# Patient Record
Sex: Female | Born: 1995
Health system: Southern US, Community
[De-identification: ages and names within clinical notes are randomized; demographics above are authoritative.]

## PROBLEM LIST (undated history)

## (undated) DIAGNOSIS — F329 Major depressive disorder, single episode, unspecified: Secondary | ICD-10-CM

## (undated) DIAGNOSIS — F32A Depression, unspecified: Secondary | ICD-10-CM

## (undated) HISTORY — PX: WISDOM TOOTH EXTRACTION: SHX21

---

## 2017-06-19 ENCOUNTER — Ambulatory Visit (HOSPITAL_COMMUNITY)
Admission: EM | Admit: 2017-06-19 | Discharge: 2017-06-19 | Disposition: A | Discharge status: Home / Self Care | Payer: Federal, State, Local not specified - PPO | Attending: Family Medicine | Admitting: Family Medicine

## 2017-06-19 ENCOUNTER — Encounter (HOSPITAL_COMMUNITY): Payer: Self-pay | Admitting: Emergency Medicine

## 2017-06-19 DIAGNOSIS — J00 Acute nasopharyngitis [common cold]: Secondary | ICD-10-CM | POA: Diagnosis not present

## 2017-06-19 MED ORDER — IPRATROPIUM BROMIDE 0.06 % NA SOLN: 2.0000 | Freq: Four times a day (QID) | NASAL | 0 refills | Status: DC

## 2017-06-19 MED ORDER — BENZONATATE 100 MG PO CAPS: 100.0000 mg | ORAL_CAPSULE | Freq: Three times a day (TID) | ORAL | 0 refills | Status: DC

## 2017-06-19 MED ORDER — FLUTICASONE PROPIONATE 50 MCG/ACT NA SUSP: 2.0000 | Freq: Every day | NASAL | 0 refills | Status: DC

## 2017-06-19 NOTE — Discharge Instructions (Signed)
Tessalon for cough. Start flonase, atrovent nasal spray for nasal congestion/drainage. You can use over the counter nasal saline rinse such as neti pot for nasal congestion. Keep hydrated, your urine should be clear to pale yellow in color. Tylenol/motrin for fever and pain. Monitor for any worsening of symptoms, chest pain, shortness of breath, wheezing, swelling of the throat, follow up for reevaluation.  ° °For sore throat try using a honey-based tea. Use 3 teaspoons of honey with juice squeezed from half lemon. Place shaved pieces of ginger into 1/2-1 cup of water and warm over stove top. Then mix the ingredients and repeat every 4 hours as needed. ° °

## 2017-06-19 NOTE — ED Triage Notes (Signed)
PT was sick last week and it resolved.   PT started with symptoms again this Monday.  PT reports headache, sinus pressure, cough, chills, bodyaches.

## 2017-06-19 NOTE — ED Provider Notes (Signed)
[facility]    CSN: [id number] Arrival date & time: 06/19/17  1254     History   Chief Complaint Chief Complaint  Patient presents with  . URI    HPI Madison Wood is a 22 y.o. female.   22 year old female comes in for 3 day history of URI symptoms. States she was sick recently, but symptoms resolved prior to current symptom onset. Post nasal drainage, rhinorrhea, nonproductive cough, sore throat, temporal headache, body aches. States symptoms usually during the night. Denies fever, but with some chills at night. States has had decreased fluid and food intake due to sore throat and decrease appetite. Drinking tea/gingerale for symptoms. Never smoker.       History reviewed. No pertinent past medical history.  There are no active problems to display for this patient.   Past Surgical History:  Procedure Laterality Date  . WISDOM TOOTH EXTRACTION      OB History    No data available       Home Medications    Prior to Admission medications   Medication Sig Start Date End Date Taking? Authorizing Provider  benzonatate (TESSALON) 100 MG capsule Take 1 capsule (100 mg total) by mouth every 8 (eight) hours. 06/19/17   Cathie HoopsYu, Amy V, PA-C  fluticasone (FLONASE) 50 MCG/ACT nasal spray Place 2 sprays into both nostrils daily. 06/19/17   Cathie HoopsYu, Amy V, PA-C  ipratropium (ATROVENT) 0.06 % nasal spray Place 2 sprays into both nostrils 4 (four) times daily. 06/19/17   Belinda FisherYu, Amy V, PA-C    Family History No family history on file.  Social History Social History   Tobacco Use  . Smoking status: Not on file  Substance Use Topics  . Alcohol use: Not on file  . Drug use: Not on file     Allergies   Patient has no known allergies.   Review of Systems Review of Systems  Reason unable to perform ROS: See HPI as above.     Physical Exam Triage Vital Signs ED Triage Vitals  Enc Vitals Group     BP 06/19/17 1319 108/68     Pulse Rate 06/19/17 1319 92     Resp  06/19/17 1319 16     Temp 06/19/17 1319 98.4 F (36.9 C)     Temp Source 06/19/17 1319 Oral     SpO2 06/19/17 1319 100 %     Weight 06/19/17 1317 140 lb (63.5 kg)     Height 06/19/17 1317 5\' 9"  (1.753 m)     Head Circumference --      Peak Flow --      Pain Score 06/19/17 1317 3     Pain Loc --      Pain Edu? --      Excl. in GC? --    No data found.  Updated Vital Signs BP 108/68   Pulse 92   Temp 98.4 F (36.9 C) (Oral)   Resp 16   Ht 5\' 9"  (1.753 m)   Wt 140 lb (63.5 kg)   LMP 05/29/2017   SpO2 100%   BMI 20.67 kg/m   Physical Exam  Constitutional: She is oriented to person, place, and time. She appears well-developed and well-nourished. No distress.  HENT:  Head: Normocephalic and atraumatic.  Right Ear: External ear and ear canal normal. Tympanic membrane is erythematous. Tympanic membrane is not bulging.  Left Ear: External ear and ear canal normal. Tympanic membrane is erythematous. Tympanic membrane is not  bulging.  Nose: Mucosal edema and rhinorrhea present. Right sinus exhibits no maxillary sinus tenderness and no frontal sinus tenderness. Left sinus exhibits no maxillary sinus tenderness and no frontal sinus tenderness.  Mouth/Throat: Uvula is midline and mucous membranes are normal. Posterior oropharyngeal erythema present. No tonsillar exudate.  Eyes: Conjunctivae are normal. Pupils are equal, round, and reactive to light.  Neck: Normal range of motion. Neck supple.  Cardiovascular: Normal rate, regular rhythm and normal heart sounds. Exam reveals no gallop and no friction rub.  No murmur heard. Pulmonary/Chest: Effort normal and breath sounds normal. She has no decreased breath sounds. She has no wheezes. She has no rhonchi. She has no rales.  Lymphadenopathy:    She has no cervical adenopathy.  Neurological: She is alert and oriented to person, place, and time.  Skin: Skin is warm and dry.  Psychiatric: She has a normal mood and affect. Her behavior is  normal. Judgment normal.    UC Treatments / Results  Labs (all labs ordered are listed, but only abnormal results are displayed) Labs Reviewed - No data to display  EKG  EKG Interpretation None       Radiology No results found.  Procedures Procedures (including critical care time)  Medications Ordered in UC Medications - No data to display   Initial Impression / Assessment and Plan / UC Course  I have reviewed the triage vital signs and the nursing notes.  Pertinent labs & imaging results that were available during my care of the patient were reviewed by me and considered in my medical decision making (see chart for details).    Discussed with patient history and exam most consistent with viral URI. Symptomatic treatment as needed. Push fluids. Return precautions given.    Final Clinical Impressions(s) / UC Diagnoses   Final diagnoses:  Acute nasopharyngitis    ED Discharge Orders        Ordered    fluticasone (FLONASE) 50 MCG/ACT nasal spray  Daily     06/19/17 1348    ipratropium (ATROVENT) 0.06 % nasal spray  4 times daily     06/19/17 1348    benzonatate (TESSALON) 100 MG capsule  Every 8 hours     06/19/17 1348        Belinda Fisher, PA-C 06/19/17 1351

## 2017-11-11 ENCOUNTER — Encounter (HOSPITAL_COMMUNITY): Payer: Self-pay | Admitting: Emergency Medicine

## 2017-11-11 ENCOUNTER — Ambulatory Visit (HOSPITAL_COMMUNITY)
Admission: EM | Admit: 2017-11-11 | Discharge: 2017-11-11 | Disposition: A | Discharge status: Home / Self Care | Payer: Federal, State, Local not specified - PPO | Attending: Family Medicine | Admitting: Family Medicine

## 2017-11-11 ENCOUNTER — Other Ambulatory Visit: Payer: Self-pay

## 2017-11-11 DIAGNOSIS — R1011 Right upper quadrant pain: Secondary | ICD-10-CM | POA: Diagnosis not present

## 2017-11-11 DIAGNOSIS — R110 Nausea: Secondary | ICD-10-CM

## 2017-11-11 MED ORDER — KETOROLAC TROMETHAMINE 60 MG/2ML IM SOLN
60.0000 mg | Freq: Once | INTRAMUSCULAR | Status: AC
  Administered 2017-11-11: 60 mg via INTRAMUSCULAR

## 2017-11-11 MED ORDER — ONDANSETRON 4 MG PO TBDP: 4.0000 mg | ORAL_TABLET | Freq: Three times a day (TID) | ORAL | 0 refills | Status: DC | PRN

## 2017-11-11 MED ORDER — KETOROLAC TROMETHAMINE 60 MG/2ML IM SOLN
INTRAMUSCULAR | Status: AC
  Filled 2017-11-11: qty 2

## 2017-11-11 NOTE — Discharge Instructions (Addendum)
You have been seen today for abdominal pain. Your evaluation was not suggestive of any emergent condition requiring medical intervention at this time. However, some abdominal problems make take more time to appear. Therefore, it is important for you to watch for any new symptoms or worsening of your current condition. Please proceed to the Emergency Department immediately should you feel worse in any way or have any of the following symptoms: increasing or different abdominal pain, persistent vomiting, fever, or shaking chills.  Return here or to the Emergency Department if your pain does not begin to improve within the next 12-24 hours.  You may take over the counter Tylenol as directed on the bottle.

## 2017-11-11 NOTE — ED Provider Notes (Signed)
[facility]   [id number] 11/11/17 Arrival Time: 0932  ASSESSMENT & PLAN:  1. RUQ abdominal pain   2. Nausea without vomiting    Possible cholelithiasis. Elects overnight observation.  Meds ordered this encounter  Medications  . ketorolac (TORADOL) injection 60 mg  . ondansetron (ZOFRAN-ODT) 4 MG disintegrating tablet    Sig: Take 1 tablet (4 mg total) by mouth every 8 (eight) hours as needed for nausea or vomiting.    Dispense:  15 tablet    Refill:  0   Follow-up Information    [facility].   Specialty:  Emergency Medicine Why:  If symptoms worsen. Contact information: [address] [id number] [place] [id number] [phone]           Discharge Instructions     You have been seen today for abdominal pain. Your evaluation was not suggestive of any emergent condition requiring medical intervention at this time. However, some abdominal problems make take more time to appear. Therefore, it is important for you to watch for any new symptoms or worsening of your current condition. Please proceed to the Emergency Department immediately should you feel worse in any way or have any of the following symptoms: increasing or different abdominal pain, persistent vomiting, fever, or shaking chills.  Return here or to the Emergency Department if your pain does not begin to improve within the next 12-24 hours.  You may take over the counter Tylenol as directed on the bottle.    Reviewed expectations re: course of current medical issues. Questions answered. Outlined signs and symptoms indicating need for more acute intervention. Patient verbalized understanding. After Visit Summary given.   SUBJECTIVE:  Madison Wood is a 22 y.o. female who presents with complaint of intermittent abdominal discomfort. Onset gradual, yesterday. Location: RUQ without radiation. Described as aching and dull. Symptoms are  stable since beginning. Aggravating factors: eating, including fluid intake. Alleviating factors: none. Associated symptoms: mild fatigue. She denies arthralgias, chills, fever and vomiting. Appetite: decreased. PO intake: decreased. Ambulatory without assistance. Urinary symptoms: none. Normal bowel movements. H/O constipation. OTC treatment: none.  Patient's last menstrual period was 11/10/2017 (exact date). Past Surgical History:  Procedure Laterality Date  . WISDOM TOOTH EXTRACTION      ROS: As per HPI.  OBJECTIVE:  Vitals:   11/11/17 0943  BP: 116/71  Pulse: 87  Resp: 16  Temp: 98.7 F (37.1 C)  TempSrc: Oral  SpO2: 100%    General appearance: alert; no distress Lungs: clear to auscultation bilaterally Heart: regular rate and rhythm Abdomen: soft; non-distended; mild tenderness over RUQ; bowel sounds present; no masses or organomegaly; no guarding or rebound tenderness Back: no CVA tenderness; FROM at hips Extremities: no edema; symmetrical with no gross deformities Skin: warm and dry Neurologic: normal gait Psychological: alert and cooperative; normal mood and affect  No Known Allergies                                             PMH: Constipation.  Social History   Socioeconomic History  . Marital status: Single    Spouse name: Not on file  . Number of children: Not on file  . Years of education: Not on file  . Highest education level: Not on file  Occupational History  . Not on file  Social Needs  . Physicist, medicalinancial resource  strain: Not on file  . Food insecurity:    Worry: Not on file    Inability: Not on file  . Transportation needs:    Medical: Not on file    Non-medical: Not on file  Tobacco Use  . Smoking status: Never Smoker  . Smokeless tobacco: Never Used  Substance and Sexual Activity  . Alcohol use: Yes  . Drug use: Never  . Sexual activity: Not on file  Lifestyle  . Physical activity:    Days per week: Not on file    Minutes per session:  Not on file  . Stress: Not on file  Relationships  . Social connections:    Talks on phone: Not on file    Gets together: Not on file    Attends religious service: Not on file    Active member of club or organization: Not on file    Attends meetings of clubs or organizations: Not on file    Relationship status: Not on file  . Intimate partner violence:    Fear of current or ex partner: Not on file    Emotionally abused: Not on file    Physically abused: Not on file    Forced sexual activity: Not on file  Other Topics Concern  . Not on file  Social History Narrative  . Not on file   Family History  Problem Relation Age of Onset  . Healthy Mother   . Diabetes Father   . Healthy Sister      Mardella Layman, MD 11/11/17 [phone]

## 2017-11-11 NOTE — ED Triage Notes (Signed)
Pt reports RUQ pain that started last night about 2000.  Pt had one episode of vomiting up water this morning.

## 2018-01-16 ENCOUNTER — Encounter (HOSPITAL_COMMUNITY): Payer: Self-pay | Admitting: Emergency Medicine

## 2018-01-16 ENCOUNTER — Other Ambulatory Visit: Payer: Self-pay

## 2018-01-16 ENCOUNTER — Emergency Department (HOSPITAL_COMMUNITY)
Admission: EM | Admit: 2018-01-16 | Discharge: 2018-01-17 | Discharge status: Against Medical Advice | Payer: Federal, State, Local not specified - PPO | Attending: Emergency Medicine | Admitting: Emergency Medicine

## 2018-01-16 DIAGNOSIS — R109 Unspecified abdominal pain: Secondary | ICD-10-CM | POA: Insufficient documentation

## 2018-01-16 DIAGNOSIS — Z5321 Procedure and treatment not carried out due to patient leaving prior to being seen by health care provider: Secondary | ICD-10-CM | POA: Insufficient documentation

## 2018-01-16 HISTORY — DX: Depression, unspecified: F32.A

## 2018-01-16 HISTORY — DX: Major depressive disorder, single episode, unspecified: F32.9

## 2018-01-16 LAB — COMPREHENSIVE METABOLIC PANEL
ALT: 10 U/L (ref 0–44)
AST: 16 U/L (ref 15–41)
Albumin: 3.8 g/dL (ref 3.5–5.0)
Alkaline Phosphatase: 63 U/L (ref 38–126)
Anion gap: 10 (ref 5–15)
BUN: <5 mg/dL — ABNORMAL LOW (ref 6–20)
CHLORIDE: 106 mmol/L (ref 98–111)
CO2: 20 mmol/L — ABNORMAL LOW (ref 22–32)
CREATININE: 0.73 mg/dL (ref 0.44–1.00)
Calcium: 8.6 mg/dL — ABNORMAL LOW (ref 8.9–10.3)
GFR calc non Af Amer: >60 mL/min (ref >60)
Glucose, Bld: 82 mg/dL (ref 70–99)
POTASSIUM: 3.6 mmol/L (ref 3.5–5.1)
SODIUM: 136 mmol/L (ref 135–145)
Total Bilirubin: 1.2 mg/dL (ref 0.3–1.2)
Total Protein: 6.6 g/dL (ref 6.5–8.1)

## 2018-01-16 LAB — URINALYSIS, ROUTINE W REFLEX MICROSCOPIC
Bilirubin Urine: NEGATIVE
GLUCOSE, UA: NEGATIVE mg/dL
Ketones, ur: 80 mg/dL — AB
Nitrite: POSITIVE — AB
PH: 5 (ref 5.0–8.0)
PROTEIN: 30 mg/dL — AB
Specific Gravity, Urine: 1.018 (ref 1.005–1.030)

## 2018-01-16 LAB — CBC
HEMATOCRIT: 38.5 % (ref 36.0–46.0)
Hemoglobin: 12.4 g/dL (ref 12.0–15.0)
MCH: 30.9 pg (ref 26.0–34.0)
MCHC: 32.2 g/dL (ref 30.0–36.0)
MCV: 96 fL (ref 78.0–100.0)
PLATELETS: 314 10*3/uL (ref 150–400)
RBC: 4.01 MIL/uL (ref 3.87–5.11)
RDW: 12.4 % (ref 11.5–15.5)
WBC: 7.7 10*3/uL (ref 4.0–10.5)

## 2018-01-16 LAB — LIPASE, BLOOD: LIPASE: 24 U/L (ref 11–51)

## 2018-01-16 LAB — I-STAT BETA HCG BLOOD, ED (MC, WL, AP ONLY): I-stat hCG, quantitative: <5 m[IU]/mL (ref <5)

## 2018-01-16 NOTE — ED Triage Notes (Addendum)
C/o sharp upper abd pain (right and left lateral), lower back pain, and nausea x 2 days.  States she had RUQ pain approx 1 month ago and was told she likely had gallstones but never had an US.  Pain isn't worse after eating.  Denies urinary complaints.

## 2018-07-23 DIAGNOSIS — Z113 Encounter for screening for infections with a predominantly sexual mode of transmission: Secondary | ICD-10-CM | POA: Diagnosis not present

## 2018-10-21 DIAGNOSIS — K080 Exfoliation of teeth due to systemic causes: Secondary | ICD-10-CM | POA: Diagnosis not present

## 2018-11-02 DIAGNOSIS — F411 Generalized anxiety disorder: Secondary | ICD-10-CM | POA: Diagnosis not present

## 2018-11-02 DIAGNOSIS — F4310 Post-traumatic stress disorder, unspecified: Secondary | ICD-10-CM | POA: Diagnosis not present

## 2018-11-02 DIAGNOSIS — Z62810 Personal history of physical and sexual abuse in childhood: Secondary | ICD-10-CM | POA: Insufficient documentation

## 2018-11-02 DIAGNOSIS — Z8659 Personal history of other mental and behavioral disorders: Secondary | ICD-10-CM | POA: Diagnosis not present

## 2018-11-04 DIAGNOSIS — E559 Vitamin D deficiency, unspecified: Secondary | ICD-10-CM | POA: Diagnosis not present

## 2018-11-04 DIAGNOSIS — Z113 Encounter for screening for infections with a predominantly sexual mode of transmission: Secondary | ICD-10-CM | POA: Diagnosis not present

## 2018-11-04 DIAGNOSIS — Z30013 Encounter for initial prescription of injectable contraceptive: Secondary | ICD-10-CM | POA: Diagnosis not present

## 2018-11-10 DIAGNOSIS — F411 Generalized anxiety disorder: Secondary | ICD-10-CM | POA: Diagnosis not present

## 2018-11-18 DIAGNOSIS — F411 Generalized anxiety disorder: Secondary | ICD-10-CM | POA: Diagnosis not present

## 2018-11-20 DIAGNOSIS — Z01411 Encounter for gynecological examination (general) (routine) with abnormal findings: Secondary | ICD-10-CM | POA: Diagnosis not present

## 2018-11-20 LAB — HM PAP SMEAR: HM Pap smear: NEGATIVE

## 2018-11-24 DIAGNOSIS — F411 Generalized anxiety disorder: Secondary | ICD-10-CM | POA: Diagnosis not present

## 2018-11-30 DIAGNOSIS — F4310 Post-traumatic stress disorder, unspecified: Secondary | ICD-10-CM | POA: Diagnosis not present

## 2018-11-30 DIAGNOSIS — F411 Generalized anxiety disorder: Secondary | ICD-10-CM | POA: Diagnosis not present

## 2018-12-17 DIAGNOSIS — F411 Generalized anxiety disorder: Secondary | ICD-10-CM | POA: Diagnosis not present

## 2019-01-11 DIAGNOSIS — F4310 Post-traumatic stress disorder, unspecified: Secondary | ICD-10-CM | POA: Diagnosis not present

## 2019-01-11 DIAGNOSIS — F411 Generalized anxiety disorder: Secondary | ICD-10-CM | POA: Diagnosis not present

## 2019-01-20 DIAGNOSIS — Z3042 Encounter for surveillance of injectable contraceptive: Secondary | ICD-10-CM | POA: Diagnosis not present

## 2019-03-05 DIAGNOSIS — F411 Generalized anxiety disorder: Secondary | ICD-10-CM | POA: Diagnosis not present

## 2019-03-08 DIAGNOSIS — F411 Generalized anxiety disorder: Secondary | ICD-10-CM | POA: Diagnosis not present

## 2019-04-07 DIAGNOSIS — Z113 Encounter for screening for infections with a predominantly sexual mode of transmission: Secondary | ICD-10-CM | POA: Diagnosis not present

## 2019-04-07 DIAGNOSIS — Z3042 Encounter for surveillance of injectable contraceptive: Secondary | ICD-10-CM | POA: Diagnosis not present

## 2019-04-09 DIAGNOSIS — A749 Chlamydial infection, unspecified: Secondary | ICD-10-CM | POA: Diagnosis not present

## 2019-05-03 DIAGNOSIS — K080 Exfoliation of teeth due to systemic causes: Secondary | ICD-10-CM | POA: Diagnosis not present

## 2019-05-03 DIAGNOSIS — A749 Chlamydial infection, unspecified: Secondary | ICD-10-CM | POA: Diagnosis not present

## 2019-06-23 DIAGNOSIS — Z3042 Encounter for surveillance of injectable contraceptive: Secondary | ICD-10-CM | POA: Diagnosis not present

## 2019-09-08 DIAGNOSIS — Z3042 Encounter for surveillance of injectable contraceptive: Secondary | ICD-10-CM | POA: Diagnosis not present

## 2019-09-08 DIAGNOSIS — Z1152 Encounter for screening for COVID-19: Secondary | ICD-10-CM | POA: Diagnosis not present

## 2019-09-08 DIAGNOSIS — Z113 Encounter for screening for infections with a predominantly sexual mode of transmission: Secondary | ICD-10-CM | POA: Diagnosis not present

## 2019-09-26 DIAGNOSIS — R109 Unspecified abdominal pain: Secondary | ICD-10-CM | POA: Diagnosis not present

## 2019-09-26 DIAGNOSIS — R1011 Right upper quadrant pain: Secondary | ICD-10-CM | POA: Diagnosis not present

## 2019-09-26 DIAGNOSIS — K829 Disease of gallbladder, unspecified: Secondary | ICD-10-CM | POA: Diagnosis not present

## 2019-09-26 DIAGNOSIS — K828 Other specified diseases of gallbladder: Secondary | ICD-10-CM | POA: Diagnosis not present

## 2019-09-26 DIAGNOSIS — N390 Urinary tract infection, site not specified: Secondary | ICD-10-CM | POA: Diagnosis not present

## 2019-09-26 DIAGNOSIS — Z87891 Personal history of nicotine dependence: Secondary | ICD-10-CM | POA: Diagnosis not present

## 2019-11-24 DIAGNOSIS — Z01419 Encounter for gynecological examination (general) (routine) without abnormal findings: Secondary | ICD-10-CM | POA: Diagnosis not present

## 2019-11-24 DIAGNOSIS — R5383 Other fatigue: Secondary | ICD-10-CM | POA: Diagnosis not present

## 2019-11-24 DIAGNOSIS — Z124 Encounter for screening for malignant neoplasm of cervix: Secondary | ICD-10-CM | POA: Diagnosis not present

## 2019-11-24 DIAGNOSIS — Z3042 Encounter for surveillance of injectable contraceptive: Secondary | ICD-10-CM | POA: Diagnosis not present

## 2019-11-24 DIAGNOSIS — Z113 Encounter for screening for infections with a predominantly sexual mode of transmission: Secondary | ICD-10-CM | POA: Diagnosis not present

## 2019-11-25 DIAGNOSIS — Z113 Encounter for screening for infections with a predominantly sexual mode of transmission: Secondary | ICD-10-CM | POA: Diagnosis not present

## 2019-11-25 DIAGNOSIS — Z124 Encounter for screening for malignant neoplasm of cervix: Secondary | ICD-10-CM | POA: Diagnosis not present

## 2019-12-14 DIAGNOSIS — F411 Generalized anxiety disorder: Secondary | ICD-10-CM | POA: Diagnosis not present

## 2020-02-10 ENCOUNTER — Encounter: Payer: Self-pay | Admitting: Family Medicine

## 2020-02-10 ENCOUNTER — Ambulatory Visit: Payer: Federal, State, Local not specified - PPO | Admitting: Family Medicine

## 2020-02-10 ENCOUNTER — Other Ambulatory Visit: Payer: Self-pay

## 2020-02-10 VITALS — BP 118/78 | Ht 69.0 in | Wt 147.6 lb

## 2020-02-10 DIAGNOSIS — L299 Pruritus, unspecified: Secondary | ICD-10-CM | POA: Diagnosis not present

## 2020-02-10 DIAGNOSIS — R109 Unspecified abdominal pain: Secondary | ICD-10-CM | POA: Insufficient documentation

## 2020-02-10 DIAGNOSIS — M25532 Pain in left wrist: Secondary | ICD-10-CM | POA: Insufficient documentation

## 2020-02-10 DIAGNOSIS — F32A Depression, unspecified: Secondary | ICD-10-CM | POA: Diagnosis not present

## 2020-02-10 LAB — CBC WITH DIFFERENTIAL/PLATELET
Immature Grans (Abs): 0 10*3/uL (ref 0.0–0.1)
MCV: 96 fL (ref 79–97)
Neutrophils Absolute: 2 10*3/uL (ref 1.4–7.0)
Platelets: 314 10*3/uL (ref 150–450)
WBC: 5.1 10*3/uL (ref 3.4–10.8)

## 2020-02-10 LAB — COMPREHENSIVE METABOLIC PANEL

## 2020-02-10 LAB — T4, FREE

## 2020-02-10 LAB — TSH

## 2020-02-10 NOTE — Patient Instructions (Signed)
Try taking over-the-counter Claritin or Allegra or Zyrtec or Xyzal for your itching and see if this helps.  Continue using a topical pain medicine for your left wrist and let me know if this is getting worse.  We will be in touch with your lab results.

## 2020-02-10 NOTE — Progress Notes (Signed)
New patient visit   Patient: Madison Wood   DOB: [birth year]   24 y.o. Female  MRN: [id number] Visit Date: 02/10/2020  Today's healthcare provider: Hetty Blend, NP-C   Chief Complaint  Patient presents with  . New Patient (Initial Visit)  . Depression   Subjective    Madison Wood is a 24 y.o. female who presents today as a new patient to establish care.  HPI  Depression Patient reports she has depression and sees a psychiatrist for it. She is currently taking Escitalopram 5 MG and Hydroxyzine 25 MG. States she is not good about taking this consistently. She sees a therapist occasionally.  No new concerns with this.   Reports intermittent RUQ pain. Had an episode in May and gallbladder disease was ruled out. That was her last episode of pain. No pain today.  Denies fever, chills, night sweats, unexplained weight loss, dizziness, chest pain, palpitations, shortness of breath, abdominal pain, N/V/D, urinary symptoms.   She sees OB/GYN.  Moved here from [place] in August.  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   Feels like her body is "on fire" at times for the past 2 months. States she feels itchy all over. Denies rash. No new medications. States she recently changes her soap and detergent to see if this helps. She has noted some improvement.   Left radial wrist and thumb pain which is improving. No known injury but she was lifting heavy boxes prior to the pain starting. No numbness, tingling or weakness. No other arthralgias.   On Depo-provera injections.     Past Medical History:  Diagnosis Date  . Depression    Past Surgical History:  Procedure Laterality Date  . WISDOM TOOTH EXTRACTION     Family Status  Relation Name Status  . Mother  Alive  . Father  Alive  . Sister  Alive   Family History  Problem Relation Age of Onset  . Healthy Mother   . Diabetes Father   . Hypertension Father   . Healthy Sister   .  Allergies Sister    Social History   Socioeconomic History  . Marital status: Single    Spouse name: Not on file  . Number of children: Not on file  . Years of education: Not on file  . Highest education level: Not on file  Occupational History  . Not on file  Tobacco Use  . Smoking status: Never Smoker  . Smokeless tobacco: Never Used  Vaping Use  . Vaping Use: Never used  Substance and Sexual Activity  . Alcohol use: Yes  . Drug use: Never  . Sexual activity: Not on file  Other Topics Concern  . Not on file  Social History Narrative  . Not on file   Social Determinants of Health   Financial Resource Strain:   . Difficulty of Paying Living Expenses: Not on file  Food Insecurity:   . Worried About Programme researcher, broadcasting/film/video in the Last Year: Not on file  . Ran Out of Food in the Last Year: Not on file  Transportation Needs:   . Lack of Transportation (Medical): Not on file  . Lack of Transportation (Non-Medical): Not on file  Physical Activity:   . Days of Exercise per Week: Not on file  . Minutes of Exercise per Session: Not on file  Stress:   . Feeling of Stress : Not on file  Social Connections:   .  Frequency of Communication with Friends and Family: Not on file  . Frequency of Social Gatherings with Friends and Family: Not on file  . Attends Religious Services: Not on file  . Active Member of Clubs or Organizations: Not on file  . Attends Banker Meetings: Not on file  . Marital Status: Not on file   Outpatient Medications Prior to Visit  Medication Sig  . escitalopram (LEXAPRO) 5 MG tablet Take 5 mg by mouth daily.  . hydrOXYzine (ATARAX/VISTARIL) 25 MG tablet Take 25 mg by mouth 3 (three) times daily.  . [DISCONTINUED] benzonatate (TESSALON) 100 MG capsule Take 1 capsule (100 mg total) by mouth every 8 (eight) hours. (Patient not taking: Reported on 02/10/2020)  . [DISCONTINUED] fluticasone (FLONASE) 50 MCG/ACT nasal spray Place 2 sprays into both  nostrils daily. (Patient not taking: Reported on 02/10/2020)  . [DISCONTINUED] ipratropium (ATROVENT) 0.06 % nasal spray Place 2 sprays into both nostrils 4 (four) times daily. (Patient not taking: Reported on 02/10/2020)  . [DISCONTINUED] ondansetron (ZOFRAN-ODT) 4 MG disintegrating tablet Take 1 tablet (4 mg total) by mouth every 8 (eight) hours as needed for nausea or vomiting. (Patient not taking: Reported on 02/10/2020)   No facility-administered medications prior to visit.   No Known Allergies   There is no immunization history on file for this patient.  Health Maintenance  Topic Date Due  . Hepatitis C Screening  Never done  . HIV Screening  Never done  . TETANUS/TDAP  Never done  . PAP-Cervical Cytology Screening  Never done  . PAP SMEAR-Modifier  Never done  . INFLUENZA VACCINE  Never done    Patient Care Team: Patient, No Pcp Per as PCP - General (General Practice)  Review of Systems Pertinent positives and negatives in the history of present illness.    Objective    BP 118/78 (BP Location: Right Arm, Patient Position: Sitting, Cuff Size: Large)   Ht 5\' 9"  (1.753 m)   Wt 147 lb 9.6 oz (67 kg)   BMI 21.80 kg/m  Physical Exam Constitutional:      Appearance: Normal appearance. She is normal weight.  Eyes:     Conjunctiva/sclera: Conjunctivae normal.  Cardiovascular:     Rate and Rhythm: Normal rate and regular rhythm.     Pulses: Normal pulses.  Pulmonary:     Effort: Pulmonary effort is normal.     Breath sounds: Normal breath sounds.  Musculoskeletal:        General: Normal range of motion.     Cervical back: Normal range of motion and neck supple.  Skin:    General: Skin is warm and dry.     Coloration: Skin is not jaundiced.     Findings: No rash.  Neurological:     General: No focal deficit present.     Mental Status: She is alert and oriented to person, place, and time.     Sensory: No sensory deficit.     Motor: No weakness.  Psychiatric:         Mood and Affect: Mood normal.        Behavior: Behavior normal.     Depression Screen No flowsheet data found. No results found for any visits on 02/10/20.  Assessment & Plan      Left wrist pain  Depression, unspecified depression type  Intermittent abdominal pain - Plan: CBC with Differential/Platelet, Comprehensive metabolic panel  Pruritus - Plan: CBC with Differential/Platelet, Comprehensive metabolic panel, TSH, T4, free  She is  a pleasant 24 year old female who is new and here to establish care.  She has several conditions to discuss but not are bothersome today.  Her left wrist exam is normal and pain almost back to baseline per patient. She will let me know if this is more bothersome.  Depression is managed by her psychiatrist. Recommend taking her medication consistently.  Reviewed abdominal US report and no sign of gallbladder disease. No pain since May. Follow up as needed for this.  Skin exam is normal. Recommend trying a non drowsy antihistamine  Follow up pending labs or as needed.     Hetty Blend, NP-C  [facility] [phone] (phone) [phone] (fax)  [facility]

## 2020-02-11 DIAGNOSIS — Z3042 Encounter for surveillance of injectable contraceptive: Secondary | ICD-10-CM | POA: Diagnosis not present

## 2020-02-11 DIAGNOSIS — E559 Vitamin D deficiency, unspecified: Secondary | ICD-10-CM | POA: Diagnosis not present

## 2020-02-11 LAB — COMPREHENSIVE METABOLIC PANEL
AST: 15 IU/L (ref 0–40)
Albumin/Globulin Ratio: 1.7 (ref 1.2–2.2)
Albumin: 4.7 g/dL (ref 3.9–5.0)
BUN/Creatinine Ratio: 11 (ref 9–23)
Bilirubin Total: 0.8 mg/dL (ref 0.0–1.2)
Calcium: 9.3 mg/dL (ref 8.7–10.2)
Chloride: 105 mmol/L (ref 96–106)
Creatinine, Ser: 0.94 mg/dL (ref 0.57–1.00)
GFR calc Af Amer: 99 mL/min/{1.73_m2} (ref >59)
GFR calc non Af Amer: 86 mL/min/{1.73_m2} (ref >59)
Globulin, Total: 2.8 g/dL (ref 1.5–4.5)
Glucose: 90 mg/dL (ref 65–99)
Sodium: 140 mmol/L (ref 134–144)

## 2020-02-11 LAB — CBC WITH DIFFERENTIAL/PLATELET
Basophils Absolute: 0 10*3/uL (ref 0.0–0.2)
Basos: 0 %
EOS (ABSOLUTE): 0.1 10*3/uL (ref 0.0–0.4)
Eos: 1 %
Hematocrit: 41 % (ref 34.0–46.6)
Hemoglobin: 13.8 g/dL (ref 11.1–15.9)
Immature Granulocytes: 0 %
Lymphocytes Absolute: 2.5 10*3/uL (ref 0.7–3.1)
Lymphs: 50 %
MCH: 32.2 pg (ref 26.6–33.0)
MCHC: 33.7 g/dL (ref 31.5–35.7)
Monocytes Absolute: 0.5 10*3/uL (ref 0.1–0.9)
Monocytes: 10 %
Neutrophils: 39 %
RBC: 4.29 x10E6/uL (ref 3.77–5.28)
RDW: 11.6 % — ABNORMAL LOW (ref 11.7–15.4)

## 2020-02-11 NOTE — Progress Notes (Signed)
Her labs are fine. Nothing to explain her itching or symptoms. Let's see how she does with taking an over the counter antihistamine like Claritin, Allegra, Zyrtec or Xyzal.

## 2020-02-14 ENCOUNTER — Telehealth: Payer: Self-pay | Admitting: Family Medicine

## 2020-02-14 NOTE — Telephone Encounter (Signed)
Pt was notified.  

## 2020-02-14 NOTE — Telephone Encounter (Signed)
Pt was seen last Thursday and had labs done she was wondering if her iron was checked because she thinks it may be low. If not she is wondering if she can come back in to have it checked.

## 2020-02-14 NOTE — Telephone Encounter (Signed)
There is no indication for me to order an iron level.  Her hemoglobin is in a good range and there is no sign of anemia.  I am not sure if insurance will cover this but I am willing to order it if she wants. She may have to pay out of pocket if not covered.

## 2020-03-02 DIAGNOSIS — F411 Generalized anxiety disorder: Secondary | ICD-10-CM | POA: Diagnosis not present

## 2020-05-10 DIAGNOSIS — Z1389 Encounter for screening for other disorder: Secondary | ICD-10-CM | POA: Diagnosis not present

## 2020-05-10 DIAGNOSIS — Z3042 Encounter for surveillance of injectable contraceptive: Secondary | ICD-10-CM | POA: Diagnosis not present

## 2020-05-10 DIAGNOSIS — Z130 Encounter for screening for diseases of the blood and blood-forming organs and certain disorders involving the immune mechanism: Secondary | ICD-10-CM | POA: Diagnosis not present

## 2020-05-10 DIAGNOSIS — Z01419 Encounter for gynecological examination (general) (routine) without abnormal findings: Secondary | ICD-10-CM | POA: Diagnosis not present

## 2020-05-10 DIAGNOSIS — Z113 Encounter for screening for infections with a predominantly sexual mode of transmission: Secondary | ICD-10-CM | POA: Diagnosis not present

## 2020-05-11 DIAGNOSIS — Z309 Encounter for contraceptive management, unspecified: Secondary | ICD-10-CM | POA: Diagnosis not present

## 2020-05-18 ENCOUNTER — Ambulatory Visit: Payer: Federal, State, Local not specified - PPO | Admitting: Family Medicine

## 2020-05-18 ENCOUNTER — Ambulatory Visit
Admission: RE | Admit: 2020-05-18 | Discharge: 2020-05-18 | Disposition: A | Discharge status: Home / Self Care | Payer: Federal, State, Local not specified - PPO | Source: Ambulatory Visit | Attending: Family Medicine | Admitting: Family Medicine

## 2020-05-18 ENCOUNTER — Other Ambulatory Visit: Payer: Self-pay

## 2020-05-18 ENCOUNTER — Encounter: Payer: Self-pay | Admitting: Family Medicine

## 2020-05-18 VITALS — BP 130/90 | HR 60 | Wt 164.4 lb

## 2020-05-18 DIAGNOSIS — M259 Joint disorder, unspecified: Secondary | ICD-10-CM

## 2020-05-18 DIAGNOSIS — R2232 Localized swelling, mass and lump, left upper limb: Secondary | ICD-10-CM

## 2020-05-18 DIAGNOSIS — M25532 Pain in left wrist: Secondary | ICD-10-CM | POA: Diagnosis not present

## 2020-05-18 DIAGNOSIS — Z833 Family history of diabetes mellitus: Secondary | ICD-10-CM | POA: Diagnosis not present

## 2020-05-18 DIAGNOSIS — R81 Glycosuria: Secondary | ICD-10-CM

## 2020-05-18 LAB — POCT GLYCOSYLATED HEMOGLOBIN (HGB A1C): Hemoglobin A1C: 5 % (ref 4.0–5.6)

## 2020-05-18 LAB — POCT URINALYSIS DIP (PROADVANTAGE DEVICE)
Bilirubin, UA: NEGATIVE
Blood, UA: NEGATIVE
Glucose, UA: NEGATIVE mg/dL
Ketones, POC UA: NEGATIVE mg/dL
Leukocytes, UA: NEGATIVE
Nitrite, UA: NEGATIVE
Protein Ur, POC: NEGATIVE mg/dL
Specific Gravity, Urine: 1.02
Urobilinogen, Ur: NEGATIVE
pH, UA: 6 (ref 5.0–8.0)

## 2020-05-18 LAB — GLUCOSE, POCT (MANUAL RESULT ENTRY): POC Glucose: 90 mg/dl (ref 70–99)

## 2020-05-18 NOTE — Progress Notes (Signed)
   Subjective:    Patient ID: Madison Wood, female    DOB: 01/28/1996, 25 y.o.   MRN: [id number]  HPI Chief Complaint  Patient presents with  . glucose    Glucose in urine at obgyn and told her to come to pcp to be tested   She is here to follow-up on glucose in her urine that was found at her OB/GYN appointment.  Her OB/GYN is [facility].  She denies personal history of prediabetes or diabetes.  Family history of DM in father and grandparents.   Denies blurry vision, polydipsia or polyuria.  She does complain of left medial wrist popping or clicking and states she has to "shake it out at times".  She denies pain.  Denies injury.  She does feel like there is a knot in the area. No numbness, tingling or weakness.  Denies any difficulty using her left hand or wrist.  Denies fever, chills, dizziness, chest pain, palpitations, shortness of breath, abdominal pain, N/V/D, urinary symptoms.   Reviewed allergies, medications, past medical, surgical, family, and social history.   Review of Systems Pertinent positives and negatives in the history of present illness.     Objective:   Physical Exam BP 130/90   Pulse 60   Wt 164 lb 6.4 oz (74.6 kg)   BMI 24.28 kg/m   Left wrist and hand on with normal sensation, motion and strength.  There is a small, firm, nontender mass of her left distal radius.     Assessment & Plan:  Glucosuria - Plan: POCT Urinalysis DIP (Proadvantage Device), POCT glycosylated hemoglobin (Hb A1C), POCT glucose (manual entry)  Family history of diabetes mellitus in father - Plan: POCT glycosylated hemoglobin (Hb A1C)  Mass of left wrist - Plan: DG Wrist Complete Left  Wrist clicking - Plan: DG Wrist Complete Left  Reviewed labs from 02/10/2020 which showed a normal glucose level. UA today is negative for glucose and is normal. Point-of-care random glucose is 90. Hemoglobin A1c 5.0%. Discussed that she does not appear to have any sign of  diabetes or prediabetes. I will send her for an x-ray of her left wrist.  She will let me know if it becomes painful or interferes with functioning.

## 2020-05-23 NOTE — Progress Notes (Signed)
She has not looked at her result note from me.

## 2020-06-01 DIAGNOSIS — F411 Generalized anxiety disorder: Secondary | ICD-10-CM | POA: Diagnosis not present

## 2020-06-01 DIAGNOSIS — F4310 Post-traumatic stress disorder, unspecified: Secondary | ICD-10-CM | POA: Diagnosis not present

## 2020-06-28 ENCOUNTER — Ambulatory Visit (INDEPENDENT_AMBULATORY_CARE_PROVIDER_SITE_OTHER): Payer: Federal, State, Local not specified - PPO | Admitting: Family Medicine

## 2020-06-28 ENCOUNTER — Other Ambulatory Visit: Payer: Self-pay

## 2020-06-28 ENCOUNTER — Encounter: Payer: Self-pay | Admitting: Family Medicine

## 2020-06-28 VITALS — BP 110/68 | HR 89 | Temp 98.8°F | Wt 163.6 lb

## 2020-06-28 DIAGNOSIS — L02412 Cutaneous abscess of left axilla: Secondary | ICD-10-CM | POA: Diagnosis not present

## 2020-06-28 MED ORDER — SULFAMETHOXAZOLE-TRIMETHOPRIM 800-160 MG PO TABS: 1.0000 | ORAL_TABLET | Freq: Two times a day (BID) | ORAL | 0 refills | Status: DC

## 2020-06-28 NOTE — Patient Instructions (Signed)
Use warm compresses often over the next few days.   Take the antibiotic as prescribed.   I will see you as scheduled on Monday      Hidradenitis Suppurativa Hidradenitis suppurativa is a long-term (chronic) skin disease. It is similar to a severe form of acne, but it affects areas of the body where acne would be unusual, especially areas of the body where skin rubs against skin and becomes moist. These include:  Underarms.  Groin.  Genital area.  Buttocks.  Upper thighs.  Breasts. Hidradenitis suppurativa may start out as small lumps or pimples caused by blocked sweat glands or hair follicles. Pimples may develop into deep sores that break open (rupture) and drain pus. Over time, affected areas of skin may thicken and become scarred. This condition is rare and does not spread from person to person (non-contagious). What are the causes? The exact cause of this condition is not known. It may be related to:  Female and female hormones.  An overactive disease-fighting system (immune system). The immune system may over-react to blocked hair follicles or sweat glands and cause swelling and pus-filled sores. What increases the risk? You are more likely to develop this condition if you:  Are female.  Are 66-38 years old.  Have a family history of hidradenitis suppurativa.  Have a personal history of acne.  Are overweight.  Smoke.  Take the medicine lithium. What are the signs or symptoms? The first symptoms are usually painful bumps in the skin, similar to pimples. The condition may get worse over time (progress), or it may only cause mild symptoms. If the disease progresses, symptoms may include:  Skin bumps getting bigger and growing deeper into the skin.  Bumps rupturing and draining pus.  Itchy, infected skin.  Skin getting thicker and scarred.  Tunnels under the skin (fistulas) where pus drains from a bump.  Pain during daily activities, such as pain during  walking if your groin area is affected.  Emotional problems, such as stress or depression. This condition may affect your appearance and your ability or willingness to wear certain clothes or do certain activities. How is this diagnosed? This condition is diagnosed by a health care provider who specializes in skin diseases (dermatologist). You may be diagnosed based on:  Your symptoms and medical history.  A physical exam.  Testing a pus sample for infection.  Blood tests. How is this treated? Your treatment will depend on how severe your symptoms are. The same treatment will not work for everybody with this condition. You may need to try several treatments to find what works best for you. Treatment may include:  Cleaning and bandaging (dressing) your wounds as needed.  Lifestyle changes, such as new skin care routines.  Taking medicines, such as: ? Antibiotics. ? Acne medicines. ? Medicines to reduce the activity of the immune system. ? A diabetes medicine (metformin). ? Birth control pills, for women. ? Steroids to reduce swelling and pain.  Working with a mental health care provider, if you experience emotional distress due to this condition. If you have severe symptoms that do not get better with medicine, you may need surgery. Surgery may involve:  Using a laser to clear the skin and remove hair follicles.  Opening and draining deep sores.  Removing the areas of skin that are diseased and scarred. Follow these instructions at home: Medicines  Take over-the-counter and prescription medicines only as told by your health care provider.  If you were prescribed an antibiotic  medicine, take it as told by your health care provider. Do not stop taking the antibiotic even if your condition improves.   Skin care  If you have open wounds, cover them with a clean dressing as told by your health care provider. Keep wounds clean by washing them gently with soap and water when you  bathe.  Do not shave the areas where you get hidradenitis suppurativa.  Do not wear deodorant.  Wear loose-fitting clothes.  Try to avoid getting overheated or sweaty. If you get sweaty or wet, change into clean, dry clothes as soon as you can.  To help relieve pain and itchiness, cover sore areas with a warm, clean washcloth (warm compress) for 5-10 minutes as often as needed.  If told by your health care provider, take a bleach bath twice a week: ? Fill your bathtub halfway with water. ? Pour in  cup of unscented household bleach. ? Soak in the tub for 5-10 minutes. ? Only soak from the neck down. Avoid water on your face and hair. ? Shower to rinse off the bleach from your skin. General instructions  Learn as much as you can about your disease so that you have an active role in your treatment. Work closely with your health care provider to find treatments that work for you.  If you are overweight, work with your health care provider to lose weight as recommended.  Do not use any products that contain nicotine or tobacco, such as cigarettes and e-cigarettes. If you need help quitting, ask your health care provider.  If you struggle with living with this condition, talk with your health care provider or work with a mental health care provider as recommended.  Keep all follow-up visits as told by your health care provider. This is important. Where to find more information  Hidradenitis Suppurativa Foundation, Inc.: https://www.hs-foundation.org/  American Academy of Dermatology: [employer] Contact a health care provider if you have:  A flare-up of hidradenitis suppurativa.  A fever or chills.  Trouble controlling your symptoms at home.  Trouble doing your daily activities because of your symptoms.  Trouble dealing with emotional problems related to your condition. Summary  Hidradenitis suppurativa is a long-term (chronic) skin disease. It is similar to a  severe form of acne, but it affects areas of the body where acne would be unusual.  The first symptoms are usually painful bumps in the skin, similar to pimples. The condition may only cause mild symptoms, or it may get worse over time (progress).  If you have open wounds, cover them with a clean dressing as told by your health care provider. Keep wounds clean by washing them gently with soap and water when you bathe.  Besides skin care, treatment may include medicines, laser treatment, and surgery. This information is not intended to replace advice given to you by your health care provider. Make sure you discuss any questions you have with your health care provider. Document Revised: 02/08/2020 Document Reviewed: 02/08/2020 Elsevier Patient Education  2021 [employer].

## 2020-06-28 NOTE — Progress Notes (Signed)
   Subjective:    Patient ID: Madison Wood, female    DOB: 09/15/1995, 25 y.o.   MRN: [id number]  HPI Chief Complaint  Patient presents with  . Abscess   She is here for 2-week history of a bump under her left arm in the axilla.  States initially it was larger, more painful and tender.  States a friend tried to "puncture it" but nothing came out. States this is the first time she is ever had an abscess anywhere.    No fever, chills, nausea, vomiting.   Review of Systems Pertinent positives and negatives in the history of present illness.     Objective:   Physical Exam BP 110/68   Pulse 89   Temp 98.8 F (37.1 C)   Wt 163 lb 9.6 oz (74.2 kg)   SpO2 99%   BMI 24.16 kg/m   Left axilla with a 3 cm elongated, slightly firm mass.  Slight tenderness to palpation.  No surrounding erythema.  No drainage.  Mild induration      Assessment & Plan:  Abscess of left axilla - Plan: sulfamethoxazole-trimethoprim (BACTRIM DS) 800-160 MG tablet  I will prescribe Bactrim, she will use warm compresses often and avoid shaving or deodorant.  Discussed that sometimes abscesses in this area can tunnel and be difficult to treat.  She will let me know if the area is worsening or if she has any other symptoms.  She also let me know if she is not back to baseline when she finishes the antibiotic.  She has an appointment with me on Monday for a CPE so I will reevaluate her then.

## 2020-07-03 ENCOUNTER — Encounter: Payer: Self-pay | Admitting: Family Medicine

## 2020-07-03 ENCOUNTER — Other Ambulatory Visit: Payer: Self-pay

## 2020-07-03 ENCOUNTER — Ambulatory Visit (INDEPENDENT_AMBULATORY_CARE_PROVIDER_SITE_OTHER): Payer: Federal, State, Local not specified - PPO | Admitting: Family Medicine

## 2020-07-03 VITALS — BP 100/64 | HR 94 | Temp 97.7°F | Ht 69.0 in | Wt 164.8 lb

## 2020-07-03 DIAGNOSIS — Z23 Encounter for immunization: Secondary | ICD-10-CM

## 2020-07-03 DIAGNOSIS — Z0000 Encounter for general adult medical examination without abnormal findings: Secondary | ICD-10-CM

## 2020-07-03 DIAGNOSIS — R519 Headache, unspecified: Secondary | ICD-10-CM | POA: Diagnosis not present

## 2020-07-03 DIAGNOSIS — F32A Depression, unspecified: Secondary | ICD-10-CM | POA: Diagnosis not present

## 2020-07-03 DIAGNOSIS — R109 Unspecified abdominal pain: Secondary | ICD-10-CM

## 2020-07-03 NOTE — Progress Notes (Signed)
Subjective:    Patient ID: Madison Wood, female    DOB: [birth year], 25 y.o.   MRN: [id number]  HPI Chief Complaint  Patient presents with  . Annual Exam    Non fasting    She is here for a complete physical exam.  Other providers: [facility]  Psychiatrist- [place]  Counselor-currently in between   States she stopped taking her antidepressants one week ago.  She is not sure if she is going to start back on them or not.  She is under the care of a psychiatrist.  I recently saw her for an abscess in her left axilla.  She is currently on Bactrim.  Using warm compresses.  States it is not any worse.  Complains of feeling like she has gained weight and is unhappy with her abdomen feeling full and "fat".  Social history: Lives alone, has a boyfriend, works as a Retail buyer for [employer] Denies smoking and drug use Drinks alcohol   Diet: fairly healthy  Excerise: nothing regularly   Immunizations: overdue for hepatitis A, meningococcal B, HPV and COVID booster.  Health maintenance:  Last Gynecological Exam: 04/2020 Last Menstrual cycle: not since Dep injections Pregnancies: 0 Last Dental Exam: over a year  Last Eye Exam:  In years   Wears seatbelt always, smoke detectors in home and functioning, does not text while driving and feels safe in home environment.   Reviewed allergies, medications, past medical, surgical, family, and social history.    Review of Systems Review of Systems Constitutional: -fever, -chills, -sweats, -unexpected weight change,-fatigue ENT: -runny nose, -ear pain, -sore throat Cardiology:  -chest pain, -palpitations, -edema Respiratory: -cough, -shortness of breath, -wheezing Gastroenterology: -abdominal pain, -nausea, -vomiting, -diarrhea, -constipation  Hematology: -bleeding or bruising problems Musculoskeletal: -arthralgias, -myalgias, -joint swelling, -back pain Ophthalmology: -vision changes Urology: -dysuria, -difficulty  urinating, -hematuria, -urinary frequency, -urgency Neurology: -headache, -weakness, -tingling, -numbness       Objective:   Physical Exam BP 100/64   Pulse 94   Temp 97.7 F (36.5 C)   Ht 5\' 9"  (1.753 m)   Wt 164 lb 12.8 oz (74.8 kg)   SpO2 98%   BMI 24.34 kg/m   General Appearance:    Alert, cooperative, no distress, appears stated age  Head:    Normocephalic, without obvious abnormality, atraumatic  Eyes:    PERRL, conjunctiva/corneas clear, EOM's intact  Ears:    Normal TM's and external ear canals  Nose:   Mask on   Throat:   Mask on   Neck:   Supple, no lymphadenopathy;  thyroid:  no   enlargement/tenderness/nodules; no JVD  Back:    Spine nontender, no curvature, ROM normal, no CVA     tenderness  Lungs:     Clear to auscultation bilaterally without wheezes, rales or     ronchi; respirations unlabored  Chest Wall:    No tenderness or deformity   Heart:    Regular rate and rhythm, S1 and S2 normal, no murmur, rub   or gallop  Breast Exam:    OB/GYN        Abdomen:     Soft, non-tender, nondistended, normoactive bowel sounds,    no masses, no hepatosplenomegaly  Genitalia:    OB/GYN    Rectal:    Not performed due to age<40 and no related complaints  Extremities:   No clubbing, cyanosis or edema  Pulses:   2+ and symmetric all extremities  Skin:   Skin color, texture, turgor  normal, no rashes.  Left axilla with a less firm and smaller mass of the skin, less tender compared to last week.  No erythema.  Lymph nodes:   Cervical and supraclavicular nodes normal.  Neurologic:   CNII-XII intact, normal strength, sensation and gait          Psych:   Normal mood, affect, hygiene and grooming.         Assessment & Plan:  Routine physical examination - Plan: CANCELED: POCT Urinalysis DIP (Proadvantage Device) -She is here for a nonfasting CPE.  Reviewed labs from October 2021 including CBC, CMP and thyroid which were all normal.  No labs done today.  Preventive health care  reviewed.  She does see her OB/GYN and is up-to-date.  I will request latest office visit and Pap smear results.  Counseling on healthy lifestyle including diet and exercise.  Immunizations reviewed.  Today we will update her Covid booster and then after 2 weeks she may return to get the other vaccines that she is deficient in including HPV, hepatitis A and meningococcal B.  Recommend getting HPV as a separate vaccine and she is aware that this is a 3 shot series. Discussed safety and health promotion.  Depression, unspecified depression type -Unclear as to why she stopped her antidepressant last week but I did encourage her to contact her psychiatrist and let them know.  I will also provide her with a list of counselors since she is in between counselors at this time.  Intermittent abdominal pain -No changes in bowel habits.  No red flag symptoms.  Encouraged trying a probiotic daily for the next 4 weeks.  She may also keep a food diary to see if certain foods are causing her discomfort.  Acute nonintractable headache, unspecified headache type -Headache since yesterday.  Encouraged good hydration, avoiding skipping meals, avoiding fluctuations in caffeine, alcohol use and recommend getting regular sleep.  Follow-up if worsening.  Need for COVID-19 vaccine - Plan: PFIZER Comirnaty(GRAY TOP)COVID-19 Vaccine

## 2020-07-03 NOTE — Patient Instructions (Addendum)
You can return for a nurse visit to get your meningococcal B vaccine, hepatitis A vaccines and HPV vaccines as we discussed. I would recommend getting the HPV vaccine separately.   Make sure you are drinking plenty of water, avoiding fluctuations in caffeine and getting regular sleep.    You can call to schedule your appointment with the counselor. A few offices are listed below for you to call.    [facility] [address] [place], [place] [address]  Phone: [facility]  [address], [place], [place] [address]  Phone: [phone]  [facility] [address] [place], [place] [address] [phone] for appointments   [facility]  [address] [place], [place] [address] [place]  (across from [facility])  [place] Resident only [address] [place],  [address] [phone]    [facility] [address] [place],  [address] [phone]      Preventive Care 24-50 Years Old, Female Preventive care refers to lifestyle choices and visits with your health care provider that can promote health and wellness. This includes:  A yearly physical exam. This is also called an annual wellness visit.  Regular dental and eye exams.  Immunizations.  Screening for certain conditions.  Healthy lifestyle choices, such as: ? Eating a healthy diet. ? Getting regular exercise. ? Not using drugs or products that contain nicotine and tobacco. ? Limiting alcohol use. What can I expect for my preventive care visit? Physical exam Your health care provider may check your:  Height and weight. These may be used to calculate your BMI (body mass index). BMI is a measurement that tells if you are at a healthy  weight.  Heart rate and blood pressure.  Body temperature.  Skin for abnormal spots. Counseling Your health care provider may ask you questions about your:  Past medical problems.  Family's medical history.  Alcohol, tobacco, and drug use.  Emotional well-being.  Home life and relationship well-being.  Sexual activity.  Diet, exercise, and sleep habits.  Work and work Statistician.  Access to firearms.  Method of birth control.  Menstrual cycle.  Pregnancy history. What immunizations do I need? Vaccines are usually given at various ages, according to a schedule. Your health care provider will recommend vaccines for you based on your age, medical history, and lifestyle or other factors, such as travel or where you work.   What tests do I need? Blood tests  Lipid and cholesterol levels. These may be checked every 5 years starting at age 24.  Hepatitis C test.  Hepatitis B test. Screening  Diabetes screening. This is done by checking your blood sugar (glucose) after you have not eaten for a while (fasting).  STD (sexually transmitted disease) testing, if you are at risk.  BRCA-related cancer screening. This may be done if you have a family history of breast, ovarian, tubal, or peritoneal cancers.  Pelvic exam and Pap test. This may be done every 3 years starting at age 24. Starting at age 69, this may be done every 5 years if you have a Pap test in combination with an HPV test. Talk with your health care provider about your test results, treatment options, and if necessary, the need for more tests.   Follow these instructions  at home: Eating and drinking  Eat a healthy diet that includes fresh fruits and vegetables, whole grains, lean protein, and low-fat dairy products.  Take vitamin and mineral supplements as recommended by your health care provider.  Do not drink alcohol if: ? Your health care provider tells you not to drink. ? You are pregnant, may be  pregnant, or are planning to become pregnant.  If you drink alcohol: ? Limit how much you have to 0-1 drink a day. ? Be aware of how much alcohol is in your drink. In the [place] one drink equals one 12 oz bottle of beer (355 mL), one 5 oz glass of wine (148 mL), or one 1 oz glass of hard liquor (44 mL).   Lifestyle  Take daily care of your teeth and gums. Brush your teeth every morning and night with fluoride toothpaste. Floss one time each day.  Stay active. Exercise for at least 30 minutes 5 or more days each week.  Do not use any products that contain nicotine or tobacco, such as cigarettes, e-cigarettes, and chewing tobacco. If you need help quitting, ask your health care provider.  Do not use drugs.  If you are sexually active, practice safe sex. Use a condom or other form of protection to prevent STIs (sexually transmitted infections).  If you do not wish to become pregnant, use a form of birth control. If you plan to become pregnant, see your health care provider for a prepregnancy visit.  Find healthy ways to cope with stress, such as: ? Meditation, yoga, or listening to music. ? Journaling. ? Talking to a trusted person. ? Spending time with friends and family. Safety  Always wear your seat belt while driving or riding in a vehicle.  Do not drive: ? If you have been drinking alcohol. Do not ride with someone who has been drinking. ? When you are tired or distracted. ? While texting.  Wear a helmet and other protective equipment during sports activities.  If you have firearms in your house, make sure you follow all gun safety procedures.  Seek help if you have been physically or sexually abused. What's next?  Go to your health care provider once a year for an annual wellness visit.  Ask your health care provider how often you should have your eyes and teeth checked.  Stay up to date on all vaccines. This information is not intended to replace advice given to you  by your health care provider. Make sure you discuss any questions you have with your health care provider. Document Revised: 12/12/2019 Document Reviewed: 12/25/2017 Elsevier Patient Education  2021 [employer].

## 2020-07-06 ENCOUNTER — Telehealth: Payer: Self-pay | Admitting: Family Medicine

## 2020-07-06 NOTE — Telephone Encounter (Signed)
Received fax in reference to request sent. No pap on file at [facility].

## 2020-07-17 ENCOUNTER — Other Ambulatory Visit (INDEPENDENT_AMBULATORY_CARE_PROVIDER_SITE_OTHER): Payer: Federal, State, Local not specified - PPO

## 2020-07-17 ENCOUNTER — Other Ambulatory Visit: Payer: Self-pay

## 2020-07-17 DIAGNOSIS — Z23 Encounter for immunization: Secondary | ICD-10-CM | POA: Diagnosis not present

## 2020-07-31 ENCOUNTER — Other Ambulatory Visit (INDEPENDENT_AMBULATORY_CARE_PROVIDER_SITE_OTHER): Payer: Federal, State, Local not specified - PPO

## 2020-07-31 ENCOUNTER — Other Ambulatory Visit: Payer: Self-pay

## 2020-07-31 DIAGNOSIS — Z23 Encounter for immunization: Secondary | ICD-10-CM

## 2020-07-31 DIAGNOSIS — Z789 Other specified health status: Secondary | ICD-10-CM

## 2020-07-31 NOTE — Progress Notes (Unsigned)
Looking at [facility] vaccine records Per Dr. Susann Givens pt does not need meningo b since she has already had 2. She does need the Hep A second dose. She has already had HPV so she does not need those

## 2020-08-04 DIAGNOSIS — Z3042 Encounter for surveillance of injectable contraceptive: Secondary | ICD-10-CM | POA: Diagnosis not present

## 2020-08-29 ENCOUNTER — Ambulatory Visit: Payer: Federal, State, Local not specified - PPO | Admitting: Medical

## 2020-08-29 ENCOUNTER — Encounter: Payer: Self-pay | Admitting: Medical

## 2020-08-29 ENCOUNTER — Other Ambulatory Visit: Payer: Self-pay

## 2020-08-29 VITALS — BP 112/70 | HR 106 | Ht 69.0 in | Wt 166.0 lb

## 2020-08-29 DIAGNOSIS — R1013 Epigastric pain: Secondary | ICD-10-CM | POA: Diagnosis not present

## 2020-08-29 DIAGNOSIS — K219 Gastro-esophageal reflux disease without esophagitis: Secondary | ICD-10-CM | POA: Diagnosis not present

## 2020-08-29 DIAGNOSIS — R0789 Other chest pain: Secondary | ICD-10-CM | POA: Diagnosis not present

## 2020-08-29 DIAGNOSIS — Z566 Other physical and mental strain related to work: Secondary | ICD-10-CM | POA: Diagnosis not present

## 2020-08-29 DIAGNOSIS — F411 Generalized anxiety disorder: Secondary | ICD-10-CM | POA: Diagnosis not present

## 2020-08-29 MED ORDER — OMEPRAZOLE 40 MG PO CPDR: 40.0000 mg | DELAYED_RELEASE_CAPSULE | Freq: Every day | ORAL | 1 refills | Status: DC

## 2020-08-29 NOTE — Progress Notes (Signed)
Subjective:  Madison Wood is a 25 y.o. female who presents for Chief Complaint  Patient presents with  . Chest Pain    Middle chest pain throughout the day. Concerns of weight gain      Here for discomfort in upper belly and chest..  having some pains in upper abdomen and chest discomfort.   Thought it was stress at first, but then wondered about it being food related.   Intermittent in nature.  No dyspnea or SOB.   No associated nausea or dizziness.  Nonsmoker.   Drinks alcohol, maybe glass of wine every few days.  No recent injury to chest.  She does eat some peppers and acidic foods.  She does drink or juice some.  She does not use a lot of hot sauce.  She is under some stress.  She is in school full-time masters degree program, she is also planning to increase her work hours to more full-time hours and she is planning her wedding  She feels really stressed and stretched with her time  She sees psychiatry and has a follow-up appointment with them today at 12 PM  No other aggravating or relieving factors.    No other c/o.  Family History  Problem Relation Age of Onset  . Healthy Mother   . Diabetes Father   . Hypertension Father   . Healthy Sister   . Allergies Sister   . Bipolar disorder Sister   . Diabetes Maternal Grandmother      The following portions of the patient's history were reviewed and updated as appropriate: allergies, current medications, past family history, past medical history, past social history, past surgical history and problem list.  ROS Otherwise as in subjective above  Objective: BP 112/70   Pulse (!) 106   Ht 5\' 9"  (1.753 m)   Wt 166 lb (75.3 kg)   SpO2 98%   BMI 24.51 kg/m   General appearance: alert, no distress, well developed, well nourished, African-American female HEENT: normocephalic, sclerae anicteric, conjunctiva pink and moist, TMs pearly, nares patent, no discharge or erythema, pharynx normal Oral cavity: MMM, no  lesions Neck: supple, no lymphadenopathy, no thyromegaly, no masses Heart: RRR, normal S1, S2, no murmurs Lungs: CTA bilaterally, no wheezes, rhonchi, or rales Abdomen: +bs, soft, mild epigastric tenderness, non tender, non distended, no masses, no hepatomegaly, no splenomegaly Chest nontender, normal I/E Pulses: 2+ radial pulses, 2+ pedal pulses, normal cap refill Ext: no edema   Assessment: Encounter Diagnoses  Name Primary?  . Epigastric pain Yes  . Chest discomfort   . Gastroesophageal reflux disease, unspecified whether esophagitis present   . Work stress      Plan: Symptoms and exam suggest GERD as the cause of her epigastric/chest pain.  Begin omeprazole, discussed risk and benefits and proper use of medicine.  Plan to use omeprazole for at least 2 weeks maybe longer.  Can also use antacids as needed.  Avoid GERD triggers as discussed.  If not much improved within the next 2 weeks then call back  We discussed her overall schedule.  I strongly advise she limit her work hours and focus on her school and masters degree program.  She is being stretched too thin at this point which is worsening her stress which is also likely worsening her GERD symptoms  [name] was seen today for chest pain.  Diagnoses and all orders for this visit:  Epigastric pain  Chest discomfort  Gastroesophageal reflux disease, unspecified whether esophagitis present  Work  stress  Other orders -     omeprazole (PRILOSEC) 40 MG capsule; Take 1 capsule (40 mg total) by mouth daily.    Follow up: 2-3 weeks with her PCP here

## 2020-09-20 ENCOUNTER — Other Ambulatory Visit: Payer: Self-pay | Admitting: Medical

## 2020-09-21 DIAGNOSIS — Z113 Encounter for screening for infections with a predominantly sexual mode of transmission: Secondary | ICD-10-CM | POA: Diagnosis not present

## 2020-10-03 DIAGNOSIS — F411 Generalized anxiety disorder: Secondary | ICD-10-CM | POA: Diagnosis not present

## 2020-10-03 DIAGNOSIS — F4310 Post-traumatic stress disorder, unspecified: Secondary | ICD-10-CM | POA: Diagnosis not present

## 2020-10-26 DIAGNOSIS — Z3042 Encounter for surveillance of injectable contraceptive: Secondary | ICD-10-CM | POA: Diagnosis not present

## 2020-11-01 ENCOUNTER — Encounter: Payer: Self-pay | Admitting: Family Medicine

## 2021-01-26 DIAGNOSIS — Z3042 Encounter for surveillance of injectable contraceptive: Secondary | ICD-10-CM | POA: Diagnosis not present

## 2021-04-26 DIAGNOSIS — Z3040 Encounter for surveillance of contraceptives, unspecified: Secondary | ICD-10-CM | POA: Diagnosis not present

## 2021-05-14 DIAGNOSIS — Z01419 Encounter for gynecological examination (general) (routine) without abnormal findings: Secondary | ICD-10-CM | POA: Diagnosis not present

## 2021-05-14 DIAGNOSIS — Z130 Encounter for screening for diseases of the blood and blood-forming organs and certain disorders involving the immune mechanism: Secondary | ICD-10-CM | POA: Diagnosis not present

## 2021-05-14 DIAGNOSIS — Z1151 Encounter for screening for human papillomavirus (HPV): Secondary | ICD-10-CM | POA: Diagnosis not present

## 2021-05-14 DIAGNOSIS — R319 Hematuria, unspecified: Secondary | ICD-10-CM | POA: Diagnosis not present

## 2021-05-14 DIAGNOSIS — Z124 Encounter for screening for malignant neoplasm of cervix: Secondary | ICD-10-CM | POA: Diagnosis not present

## 2021-05-14 LAB — RESULTS CONSOLE HPV: CHL HPV: NEGATIVE

## 2021-05-14 LAB — HM PAP SMEAR: HM Pap smear: NEGATIVE

## 2021-06-05 DIAGNOSIS — F4010 Social phobia, unspecified: Secondary | ICD-10-CM | POA: Diagnosis not present

## 2021-06-07 DIAGNOSIS — F4312 Post-traumatic stress disorder, chronic: Secondary | ICD-10-CM | POA: Diagnosis not present

## 2021-06-28 DIAGNOSIS — F4010 Social phobia, unspecified: Secondary | ICD-10-CM | POA: Diagnosis not present

## 2021-06-28 DIAGNOSIS — F4312 Post-traumatic stress disorder, chronic: Secondary | ICD-10-CM | POA: Diagnosis not present

## 2021-07-02 DIAGNOSIS — F4312 Post-traumatic stress disorder, chronic: Secondary | ICD-10-CM | POA: Diagnosis not present

## 2021-07-02 DIAGNOSIS — F4010 Social phobia, unspecified: Secondary | ICD-10-CM | POA: Diagnosis not present

## 2021-07-09 DIAGNOSIS — F4312 Post-traumatic stress disorder, chronic: Secondary | ICD-10-CM | POA: Diagnosis not present

## 2021-07-09 DIAGNOSIS — F4010 Social phobia, unspecified: Secondary | ICD-10-CM | POA: Diagnosis not present

## 2021-07-11 DIAGNOSIS — F4312 Post-traumatic stress disorder, chronic: Secondary | ICD-10-CM | POA: Diagnosis not present

## 2021-07-11 DIAGNOSIS — F4010 Social phobia, unspecified: Secondary | ICD-10-CM | POA: Diagnosis not present

## 2021-07-16 DIAGNOSIS — F4010 Social phobia, unspecified: Secondary | ICD-10-CM | POA: Diagnosis not present

## 2021-07-16 DIAGNOSIS — F4312 Post-traumatic stress disorder, chronic: Secondary | ICD-10-CM | POA: Diagnosis not present

## 2021-07-18 DIAGNOSIS — F4312 Post-traumatic stress disorder, chronic: Secondary | ICD-10-CM | POA: Diagnosis not present

## 2021-07-18 DIAGNOSIS — F4010 Social phobia, unspecified: Secondary | ICD-10-CM | POA: Diagnosis not present

## 2021-07-19 DIAGNOSIS — Z3042 Encounter for surveillance of injectable contraceptive: Secondary | ICD-10-CM | POA: Diagnosis not present

## 2021-07-23 DIAGNOSIS — F4312 Post-traumatic stress disorder, chronic: Secondary | ICD-10-CM | POA: Diagnosis not present

## 2021-07-23 DIAGNOSIS — F4010 Social phobia, unspecified: Secondary | ICD-10-CM | POA: Diagnosis not present

## 2021-07-25 DIAGNOSIS — F4010 Social phobia, unspecified: Secondary | ICD-10-CM | POA: Diagnosis not present

## 2021-07-25 DIAGNOSIS — F4312 Post-traumatic stress disorder, chronic: Secondary | ICD-10-CM | POA: Diagnosis not present

## 2021-07-30 DIAGNOSIS — F4010 Social phobia, unspecified: Secondary | ICD-10-CM | POA: Diagnosis not present

## 2021-07-30 DIAGNOSIS — F4312 Post-traumatic stress disorder, chronic: Secondary | ICD-10-CM | POA: Diagnosis not present

## 2021-07-31 DIAGNOSIS — F411 Generalized anxiety disorder: Secondary | ICD-10-CM | POA: Diagnosis not present

## 2021-07-31 DIAGNOSIS — F331 Major depressive disorder, recurrent, moderate: Secondary | ICD-10-CM | POA: Diagnosis not present

## 2021-08-01 DIAGNOSIS — F4010 Social phobia, unspecified: Secondary | ICD-10-CM | POA: Diagnosis not present

## 2021-08-01 DIAGNOSIS — F4312 Post-traumatic stress disorder, chronic: Secondary | ICD-10-CM | POA: Diagnosis not present

## 2021-08-06 DIAGNOSIS — F4312 Post-traumatic stress disorder, chronic: Secondary | ICD-10-CM | POA: Diagnosis not present

## 2021-08-06 DIAGNOSIS — F4010 Social phobia, unspecified: Secondary | ICD-10-CM | POA: Diagnosis not present

## 2021-08-08 DIAGNOSIS — F4312 Post-traumatic stress disorder, chronic: Secondary | ICD-10-CM | POA: Diagnosis not present

## 2021-08-08 DIAGNOSIS — F4010 Social phobia, unspecified: Secondary | ICD-10-CM | POA: Diagnosis not present

## 2021-08-13 DIAGNOSIS — F4312 Post-traumatic stress disorder, chronic: Secondary | ICD-10-CM | POA: Diagnosis not present

## 2021-08-13 DIAGNOSIS — F4010 Social phobia, unspecified: Secondary | ICD-10-CM | POA: Diagnosis not present

## 2021-08-15 DIAGNOSIS — F4312 Post-traumatic stress disorder, chronic: Secondary | ICD-10-CM | POA: Diagnosis not present

## 2021-08-15 DIAGNOSIS — F411 Generalized anxiety disorder: Secondary | ICD-10-CM | POA: Diagnosis not present

## 2021-08-15 DIAGNOSIS — F4010 Social phobia, unspecified: Secondary | ICD-10-CM | POA: Diagnosis not present

## 2021-08-15 DIAGNOSIS — F331 Major depressive disorder, recurrent, moderate: Secondary | ICD-10-CM | POA: Diagnosis not present

## 2021-08-20 DIAGNOSIS — F4010 Social phobia, unspecified: Secondary | ICD-10-CM | POA: Diagnosis not present

## 2021-08-20 DIAGNOSIS — F4312 Post-traumatic stress disorder, chronic: Secondary | ICD-10-CM | POA: Diagnosis not present

## 2021-08-22 DIAGNOSIS — F4312 Post-traumatic stress disorder, chronic: Secondary | ICD-10-CM | POA: Diagnosis not present

## 2021-08-22 DIAGNOSIS — F4010 Social phobia, unspecified: Secondary | ICD-10-CM | POA: Diagnosis not present

## 2021-08-27 DIAGNOSIS — F4010 Social phobia, unspecified: Secondary | ICD-10-CM | POA: Diagnosis not present

## 2021-08-27 DIAGNOSIS — F4312 Post-traumatic stress disorder, chronic: Secondary | ICD-10-CM | POA: Diagnosis not present

## 2021-08-29 DIAGNOSIS — F331 Major depressive disorder, recurrent, moderate: Secondary | ICD-10-CM | POA: Diagnosis not present

## 2021-08-29 DIAGNOSIS — F4010 Social phobia, unspecified: Secondary | ICD-10-CM | POA: Diagnosis not present

## 2021-08-29 DIAGNOSIS — F411 Generalized anxiety disorder: Secondary | ICD-10-CM | POA: Diagnosis not present

## 2021-08-29 DIAGNOSIS — F4312 Post-traumatic stress disorder, chronic: Secondary | ICD-10-CM | POA: Diagnosis not present

## 2021-09-05 DIAGNOSIS — F4010 Social phobia, unspecified: Secondary | ICD-10-CM | POA: Diagnosis not present

## 2021-09-05 DIAGNOSIS — F4312 Post-traumatic stress disorder, chronic: Secondary | ICD-10-CM | POA: Diagnosis not present

## 2021-09-10 DIAGNOSIS — F4010 Social phobia, unspecified: Secondary | ICD-10-CM | POA: Diagnosis not present

## 2021-09-10 DIAGNOSIS — F4312 Post-traumatic stress disorder, chronic: Secondary | ICD-10-CM | POA: Diagnosis not present

## 2021-09-12 DIAGNOSIS — F4010 Social phobia, unspecified: Secondary | ICD-10-CM | POA: Diagnosis not present

## 2021-09-12 DIAGNOSIS — F4312 Post-traumatic stress disorder, chronic: Secondary | ICD-10-CM | POA: Diagnosis not present

## 2021-09-19 DIAGNOSIS — F4312 Post-traumatic stress disorder, chronic: Secondary | ICD-10-CM | POA: Diagnosis not present

## 2021-09-19 DIAGNOSIS — F4010 Social phobia, unspecified: Secondary | ICD-10-CM | POA: Diagnosis not present

## 2021-09-26 DIAGNOSIS — F331 Major depressive disorder, recurrent, moderate: Secondary | ICD-10-CM | POA: Diagnosis not present

## 2021-09-26 DIAGNOSIS — F4312 Post-traumatic stress disorder, chronic: Secondary | ICD-10-CM | POA: Diagnosis not present

## 2021-09-26 DIAGNOSIS — F411 Generalized anxiety disorder: Secondary | ICD-10-CM | POA: Diagnosis not present

## 2021-09-26 DIAGNOSIS — F4010 Social phobia, unspecified: Secondary | ICD-10-CM | POA: Diagnosis not present

## 2021-10-04 DIAGNOSIS — Z3042 Encounter for surveillance of injectable contraceptive: Secondary | ICD-10-CM | POA: Diagnosis not present

## 2021-10-04 DIAGNOSIS — R5383 Other fatigue: Secondary | ICD-10-CM | POA: Diagnosis not present

## 2021-10-10 DIAGNOSIS — F4312 Post-traumatic stress disorder, chronic: Secondary | ICD-10-CM | POA: Diagnosis not present

## 2021-10-10 DIAGNOSIS — F4010 Social phobia, unspecified: Secondary | ICD-10-CM | POA: Diagnosis not present

## 2021-10-17 DIAGNOSIS — F4010 Social phobia, unspecified: Secondary | ICD-10-CM | POA: Diagnosis not present

## 2021-10-17 DIAGNOSIS — F4312 Post-traumatic stress disorder, chronic: Secondary | ICD-10-CM | POA: Diagnosis not present

## 2021-10-21 IMAGING — CR DG WRIST COMPLETE 3+V*L*
4 series · 4 of 4 positions shown · non-contrast
Comparison: None.

CLINICAL DATA: Popping and pain of left wrist, mass at the medial
aspect for 6 months

EXAM:
LEFT WRIST - COMPLETE 3+ VIEW

[x wrist pa left]
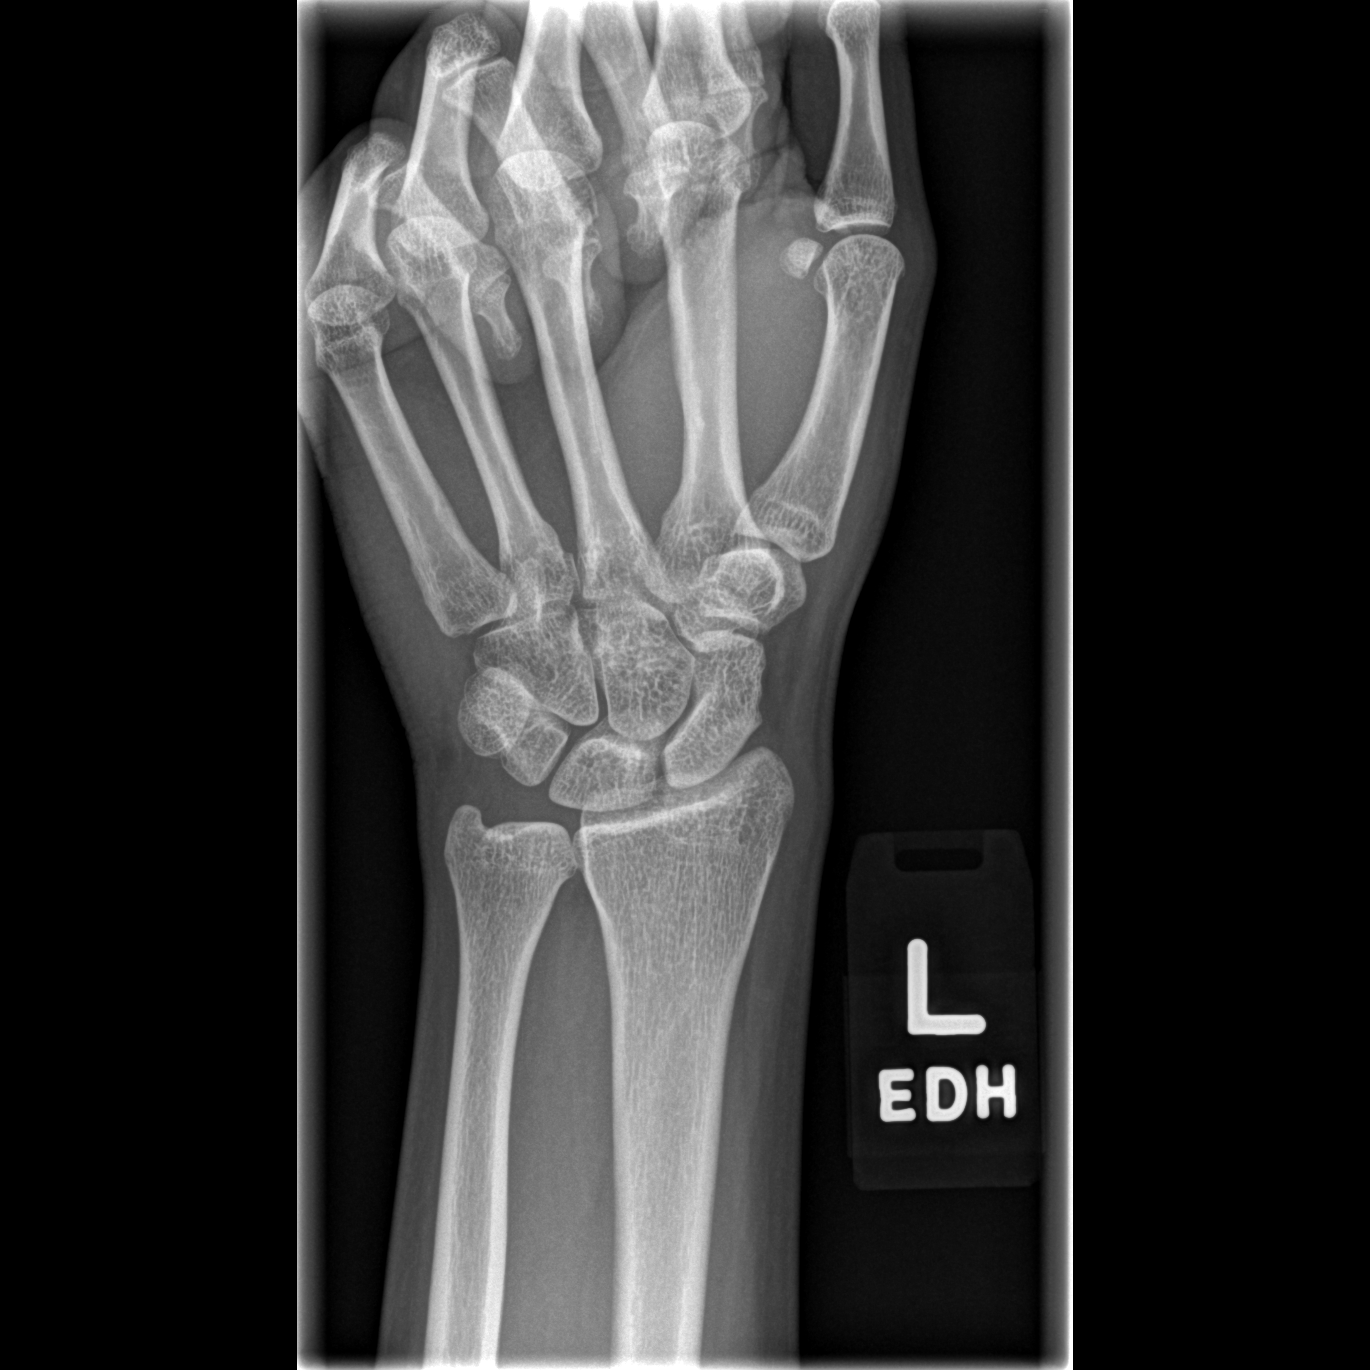

[x wrist obl left]
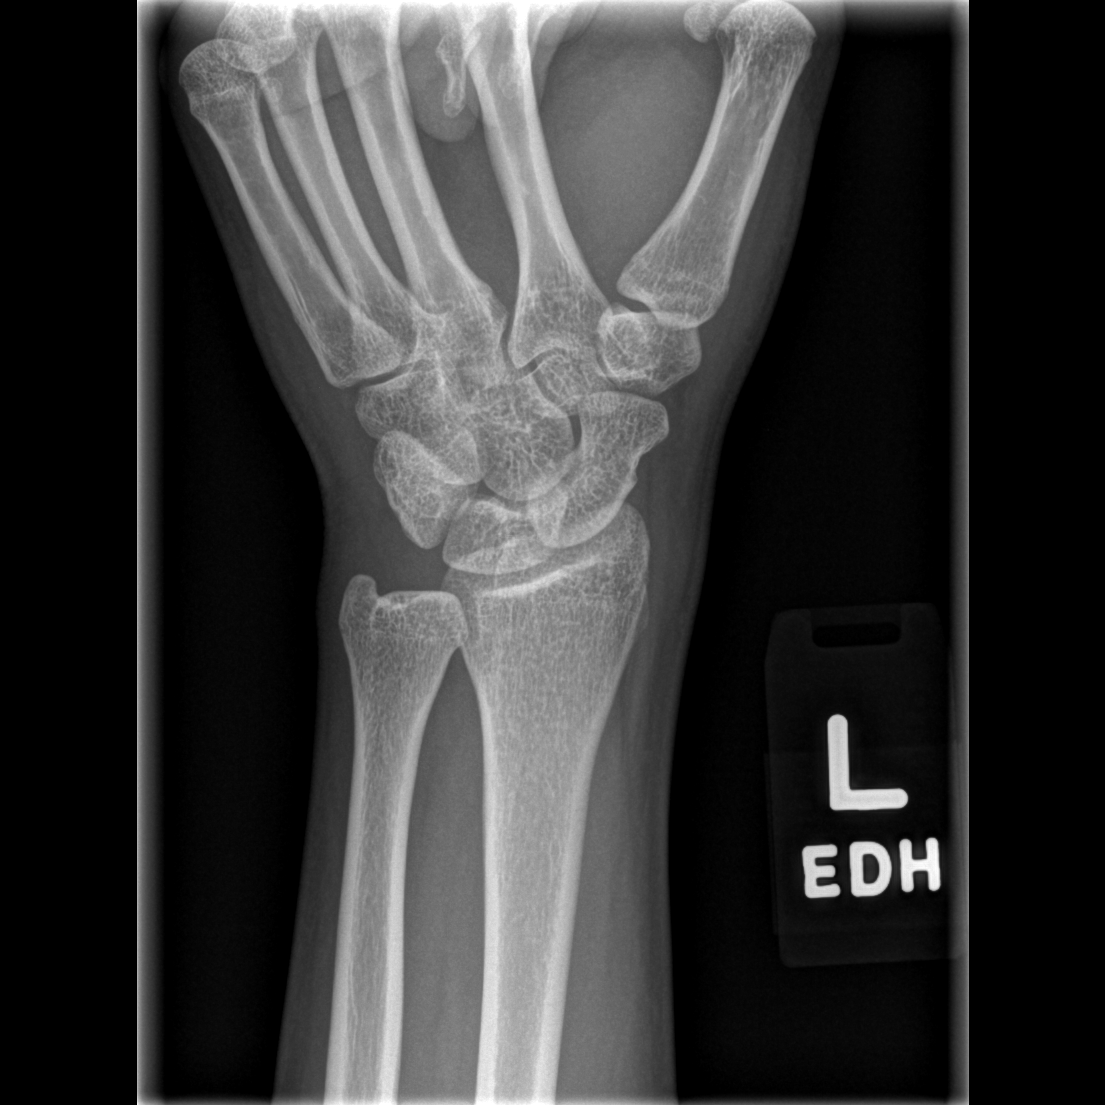

[x wrist lat left]
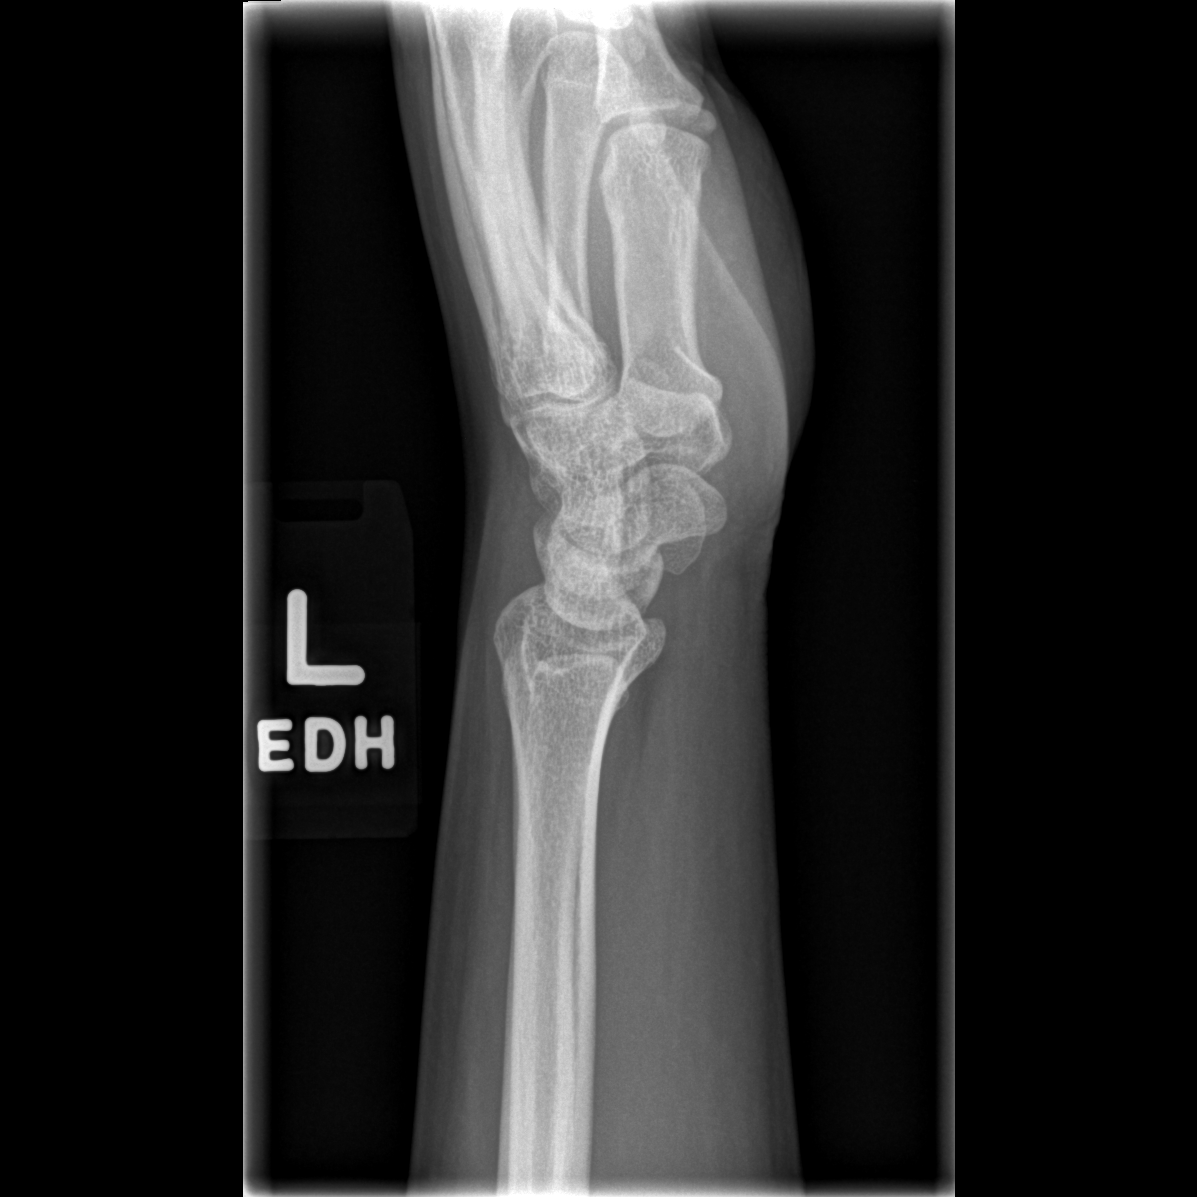

[x navicular]
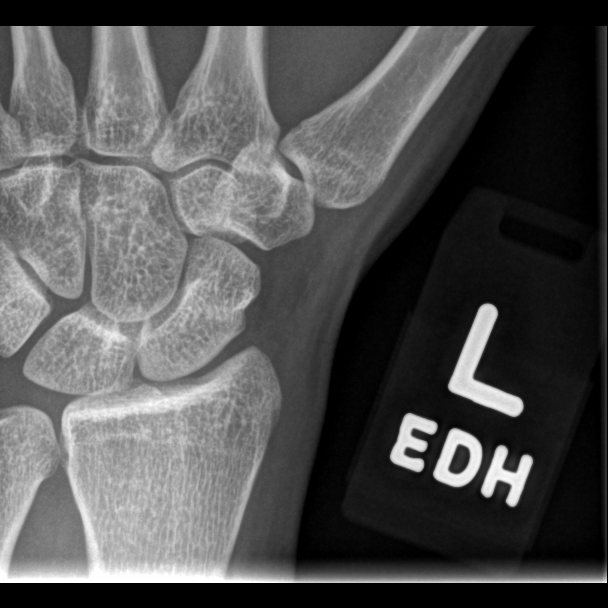

[4 of 4 positions shown; findings below may reference images not displayed]

FINDINGS: There is no evidence of fracture or dislocation. There is no
evidence of arthropathy or other focal bone abnormality. Soft
tissues are unremarkable.
IMPRESSION: No fracture or dislocation of the left wrist. No radiographic
abnormality. No radiographic evidence of reported soft tissue mass.
MRI may be used to further evaluate suspected soft tissue mass.

## 2021-10-24 DIAGNOSIS — F4312 Post-traumatic stress disorder, chronic: Secondary | ICD-10-CM | POA: Diagnosis not present

## 2021-10-24 DIAGNOSIS — F4010 Social phobia, unspecified: Secondary | ICD-10-CM | POA: Diagnosis not present

## 2021-10-24 DIAGNOSIS — F331 Major depressive disorder, recurrent, moderate: Secondary | ICD-10-CM | POA: Diagnosis not present

## 2021-10-24 DIAGNOSIS — F411 Generalized anxiety disorder: Secondary | ICD-10-CM | POA: Diagnosis not present

## 2021-10-31 DIAGNOSIS — F4312 Post-traumatic stress disorder, chronic: Secondary | ICD-10-CM | POA: Diagnosis not present

## 2021-10-31 DIAGNOSIS — F4010 Social phobia, unspecified: Secondary | ICD-10-CM | POA: Diagnosis not present

## 2021-11-05 DIAGNOSIS — F4010 Social phobia, unspecified: Secondary | ICD-10-CM | POA: Diagnosis not present

## 2021-11-05 DIAGNOSIS — F4312 Post-traumatic stress disorder, chronic: Secondary | ICD-10-CM | POA: Diagnosis not present

## 2021-11-21 DIAGNOSIS — F331 Major depressive disorder, recurrent, moderate: Secondary | ICD-10-CM | POA: Diagnosis not present

## 2021-11-21 DIAGNOSIS — F411 Generalized anxiety disorder: Secondary | ICD-10-CM | POA: Diagnosis not present

## 2021-11-21 DIAGNOSIS — F4312 Post-traumatic stress disorder, chronic: Secondary | ICD-10-CM | POA: Diagnosis not present

## 2021-11-21 DIAGNOSIS — F4010 Social phobia, unspecified: Secondary | ICD-10-CM | POA: Diagnosis not present

## 2021-12-05 DIAGNOSIS — F411 Generalized anxiety disorder: Secondary | ICD-10-CM | POA: Diagnosis not present

## 2021-12-05 DIAGNOSIS — F4010 Social phobia, unspecified: Secondary | ICD-10-CM | POA: Diagnosis not present

## 2021-12-05 DIAGNOSIS — F4312 Post-traumatic stress disorder, chronic: Secondary | ICD-10-CM | POA: Diagnosis not present

## 2021-12-05 DIAGNOSIS — F331 Major depressive disorder, recurrent, moderate: Secondary | ICD-10-CM | POA: Diagnosis not present

## 2021-12-19 DIAGNOSIS — F4010 Social phobia, unspecified: Secondary | ICD-10-CM | POA: Diagnosis not present

## 2021-12-19 DIAGNOSIS — F4312 Post-traumatic stress disorder, chronic: Secondary | ICD-10-CM | POA: Diagnosis not present

## 2021-12-21 DIAGNOSIS — Z3042 Encounter for surveillance of injectable contraceptive: Secondary | ICD-10-CM | POA: Diagnosis not present

## 2021-12-24 ENCOUNTER — Encounter: Payer: Self-pay | Admitting: Medical

## 2021-12-24 ENCOUNTER — Ambulatory Visit: Payer: Federal, State, Local not specified - PPO | Admitting: Medical

## 2021-12-24 VITALS — BP 120/74 | HR 93 | Temp 98.5°F | Wt 178.8 lb

## 2021-12-24 DIAGNOSIS — R197 Diarrhea, unspecified: Secondary | ICD-10-CM | POA: Diagnosis not present

## 2021-12-24 DIAGNOSIS — Z789 Other specified health status: Secondary | ICD-10-CM | POA: Diagnosis not present

## 2021-12-24 DIAGNOSIS — R109 Unspecified abdominal pain: Secondary | ICD-10-CM

## 2021-12-24 LAB — POCT URINALYSIS DIP (PROADVANTAGE DEVICE)
Bilirubin, UA: NEGATIVE
Glucose, UA: NEGATIVE mg/dL
Ketones, POC UA: NEGATIVE mg/dL
Leukocytes, UA: NEGATIVE
Nitrite, UA: NEGATIVE
Protein Ur, POC: NEGATIVE mg/dL
Specific Gravity, Urine: 1.025
Urobilinogen, Ur: 0.2
pH, UA: 6 (ref 5.0–8.0)

## 2021-12-24 LAB — POCT URINE PREGNANCY: Preg Test, Ur: NEGATIVE

## 2021-12-24 NOTE — Progress Notes (Signed)
Subjective:  Madison Wood is a 26 y.o. female who presents for Chief Complaint  Patient presents with   other    Stomach pain started 1 week ago, diarrhea, when she eats hurts her stomach and cause diarrhea      Here for stomach pains this past week.   Gets acid reflux easily, but this seems worse that her usual GERD.  The pain seems a little different than her GERD.  Having some diarrhea as well.  Feeling hungry, but then still gets pain after eating.  No fever.  No body aches or chills.  No urinary changes.    Pain currently more mid abdomen around belly button.  Whereas her GERD normally causes pains in rib area.  Some nausea, but no vomiting.  Initially had diarrhea a lot, multiple times daily, 10-15 stools.  Now having about 4-6 loose stools daily.  Diarrhea is now dark green after taking some pepto bismol type stuff, but was originally brown.  No blood in stool.    Has had some cough, but attributed this to URI about a month ago . Has residual occasional cough.    No sick contacts.   Went on a cruise a month ago to [place].  Had diarrhea right after the cruise.  That lasted about 10 days, then got better.    So some time went by before this current week of symptoms .   Lexapro is now liquid, just changed a month ago from tablet.  No other new med changes.    No regular NSAID use, no recent alcohol, no hx/o ulcer.    No other aggravating or relieving factors.    No other c/o.  Past Medical History:  Diagnosis Date   Depression    Current Outpatient Medications on File Prior to Visit  Medication Sig Dispense Refill   escitalopram (LEXAPRO) 10 MG tablet Take 1 tablet by mouth daily.     hydrOXYzine (ATARAX/VISTARIL) 25 MG tablet Take 25 mg by mouth 3 (three) times daily.     Loratadine (CLARITIN PO) Take by mouth.     medroxyPROGESTERone (DEPO-PROVERA) 150 MG/ML injection medroxyprogesterone 150 mg/mL intramuscular suspension     VITAMIN D PO Take by mouth.     omeprazole  (PRILOSEC) 40 MG capsule Take 1 capsule (40 mg total) by mouth daily. (Patient not taking: Reported on 12/24/2021) 30 capsule 1   sulfamethoxazole-trimethoprim (BACTRIM DS) 800-160 MG tablet Take 1 tablet by mouth 2 (two) times daily. (Patient not taking: Reported on 08/29/2020) 20 tablet 0   No current facility-administered medications on file prior to visit.    The following portions of the patient's history were reviewed and updated as appropriate: allergies, current medications, past family history, past medical history, past social history, past surgical history and problem list.  ROS Otherwise as in subjective above   Objective: BP 120/74   Pulse 93   Temp 98.5 F (36.9 C)   Wt 178 lb 12.8 oz (81.1 kg)   BMI 26.40 kg/m   General appearance: alert, no distress, well developed, well nourished Oral cavity: MMM, no lesions Neck: supple, no lymphadenopathy, no thyromegaly, no masses Heart: RRR, normal S1, S2, no murmurs Lungs: CTA bilaterally, no wheezes, rhonchi, or rales Abdomen: +bs, soft, mild generalized tenderness, non distended, no masses, no hepatomegaly, no splenomegaly Pulses: 2+ radial pulses, 2+ pedal pulses, normal cap refill Ext: no edema   Assessment: Encounter Diagnoses  Name Primary?   Diarrhea, unspecified type Yes   Abdominal pain,  unspecified abdominal location    Recent travel on cruise ship      Plan: I suspect infectious etiology given symptoms and recent travel.   Recommendations: Collect and turn into stool studies. You can use over-the-counter Pepto-Bismol 2-3 times daily for the next few days Consider taking a probiotic daily such as Align, IB Guard or other.  Focus on good water intake Avoid spicy foods, greasy foods, citrus or tomato-based foods until this resolves. Given the fact that your stools are getting better and less frequent, you are probably going in the right direction. We will call with results and other  recommendations   Kacy was seen today for other.  Diagnoses and all orders for this visit:  Diarrhea, unspecified type -     GI Profile, Stool, PCR; Future  Abdominal pain, unspecified abdominal location -     POCT Urinalysis DIP (Proadvantage Device) -     POCT urine pregnancy -     GI Profile, Stool, PCR; Future  Recent travel on cruise ship -     GI Profile, Stool, PCR; Future    Follow up: pending stool studies

## 2021-12-24 NOTE — Patient Instructions (Signed)
Encounter Diagnoses  Name Primary?   Diarrhea, unspecified type Yes   Abdominal pain, unspecified abdominal location    Recent travel on cruise ship    Collect and turn into stool studies.  You can use over-the-counter Pepto-Bismol 2-3 times daily for the next few days  Consider taking a probitoic daily such as Align, IB Guard or other.   Focus on good water intake  Avoid spicy foods, greasy foods, citrus or tomato-based foods until this resolves.  Given the fact that your stools are getting better and less frequent, you are probably going in the right direction.   We will call with results and other recommendations

## 2021-12-26 ENCOUNTER — Telehealth: Payer: Self-pay | Admitting: Physician Assistant

## 2021-12-26 NOTE — Telephone Encounter (Signed)
Pt informed

## 2021-12-26 NOTE — Telephone Encounter (Signed)
Pt called she has not been able to do stool specimen  She said her symptoms actually started getting some better the day she came in

## 2022-01-02 ENCOUNTER — Encounter: Payer: Self-pay | Admitting: Internal Medicine

## 2022-01-02 DIAGNOSIS — F4010 Social phobia, unspecified: Secondary | ICD-10-CM | POA: Diagnosis not present

## 2022-01-02 DIAGNOSIS — F4312 Post-traumatic stress disorder, chronic: Secondary | ICD-10-CM | POA: Diagnosis not present

## 2022-01-21 ENCOUNTER — Encounter: Payer: Self-pay | Admitting: Family Medicine

## 2022-01-21 ENCOUNTER — Telehealth: Payer: Federal, State, Local not specified - PPO | Admitting: Family Medicine

## 2022-01-21 VITALS — Ht 69.0 in | Wt 180.0 lb

## 2022-01-21 DIAGNOSIS — R109 Unspecified abdominal pain: Secondary | ICD-10-CM | POA: Diagnosis not present

## 2022-01-21 NOTE — Progress Notes (Signed)
   Subjective:    Patient ID: Madison Wood, female    DOB: 02-01-1996, 26 y.o.   MRN: [id number]  HPI Documentation for virtual audio and video telecommunications through Friend encounter The patient was located at home. 2 patient identifiers used.  The provider was located in the office. The patient did consent to this visit and is aware of possible charges through their insurance for this visit. The other persons participating in this telemedicine service were none. Time spent on call was 5 minutes and in review of previous records >15 minutes total for counseling and coordination of care. This virtual service is not related to other E/M service within previous 7 days.  She states that last Thursday she had a slight sore throat but no fever, chills or coughing.  The next day she developed some nasal congestion and malaise but says that this is essentially gone away but today she is having some abdominal pain but no nausea, vomiting or diarrhea.  She apparently had difficulty like this in the past.  Of note is the fact that she apparently started a new job last Thursday.  Review of Systems     Objective:   Physical Exam Alert and in no distress otherwise not examined       Assessment & Plan:  Intermittent abdominal pain Recommend conservative care with go ahead and start eating with plenty of fluids and try some Prilosec and if continued difficulty return here.  Reassured her that it was okay for her to return to work.

## 2022-01-25 DIAGNOSIS — J209 Acute bronchitis, unspecified: Secondary | ICD-10-CM | POA: Diagnosis not present

## 2022-02-05 ENCOUNTER — Encounter: Payer: Self-pay | Admitting: Internal Medicine

## 2022-02-12 DIAGNOSIS — J00 Acute nasopharyngitis [common cold]: Secondary | ICD-10-CM | POA: Diagnosis not present

## 2022-02-22 ENCOUNTER — Encounter: Payer: Federal, State, Local not specified - PPO | Admitting: Medical

## 2022-03-08 DIAGNOSIS — Z3042 Encounter for surveillance of injectable contraceptive: Secondary | ICD-10-CM | POA: Diagnosis not present

## 2022-03-12 ENCOUNTER — Encounter: Payer: Self-pay | Admitting: Internal Medicine

## 2022-04-24 DIAGNOSIS — R110 Nausea: Secondary | ICD-10-CM | POA: Diagnosis not present

## 2022-04-24 DIAGNOSIS — Z3202 Encounter for pregnancy test, result negative: Secondary | ICD-10-CM | POA: Diagnosis not present

## 2022-04-24 DIAGNOSIS — R109 Unspecified abdominal pain: Secondary | ICD-10-CM | POA: Diagnosis not present

## 2022-05-24 DIAGNOSIS — Z3042 Encounter for surveillance of injectable contraceptive: Secondary | ICD-10-CM | POA: Diagnosis not present

## 2022-05-24 DIAGNOSIS — Z01419 Encounter for gynecological examination (general) (routine) without abnormal findings: Secondary | ICD-10-CM | POA: Diagnosis not present

## 2022-05-24 DIAGNOSIS — Z130 Encounter for screening for diseases of the blood and blood-forming organs and certain disorders involving the immune mechanism: Secondary | ICD-10-CM | POA: Diagnosis not present

## 2022-05-24 DIAGNOSIS — Z1389 Encounter for screening for other disorder: Secondary | ICD-10-CM | POA: Diagnosis not present

## 2022-06-12 DIAGNOSIS — F4010 Social phobia, unspecified: Secondary | ICD-10-CM | POA: Diagnosis not present

## 2022-06-12 DIAGNOSIS — F4312 Post-traumatic stress disorder, chronic: Secondary | ICD-10-CM | POA: Diagnosis not present

## 2022-06-14 ENCOUNTER — Encounter: Payer: Self-pay | Admitting: Nurse Practitioner

## 2022-06-14 ENCOUNTER — Ambulatory Visit: Payer: Federal, State, Local not specified - PPO | Admitting: Nurse Practitioner

## 2022-06-14 VITALS — BP 122/86 | HR 78 | Wt 183.2 lb

## 2022-06-14 DIAGNOSIS — F341 Dysthymic disorder: Secondary | ICD-10-CM | POA: Diagnosis not present

## 2022-06-14 MED ORDER — HYDROXYZINE HCL 25 MG PO TABS: 25.0000 mg | ORAL_TABLET | Freq: Every evening | ORAL | 6 refills | Status: DC | PRN

## 2022-06-14 MED ORDER — ESCITALOPRAM OXALATE 5 MG/5ML PO SOLN: 10.0000 mg | Freq: Every day | ORAL | 6 refills | Status: DC

## 2022-06-14 MED ORDER — BUSPIRONE HCL 5 MG PO TABS: 5.0000 mg | ORAL_TABLET | Freq: Three times a day (TID) | ORAL | 6 refills | Status: DC

## 2022-06-14 NOTE — Progress Notes (Signed)
Orma Render, DNP, AGNP-c [facility] [address] [place], [place] [address] [phone]  Subjective:   Madison Wood is a 27 y.o. female presents to day for evaluation of: Medication Management Madison Wood presents today for a follow-up regarding her medication management. She reports having ceased her prescribed medications about six months ago due to expiration and is now seeking to refill her prescriptions. She is requesting a refill of escitalopram and hydroxyzine, with a preference for the liquid form, although she has also brought the tablet form. She has been advised by her therapist to prioritize medication management, as recent mood screening results show a deterioration in symptoms.   The patient has reinitiated therapy sessions, with the most recent session taking place on Wednesday. Her therapist has suggested increasing the frequency of these sessions to twice weekly, a strategy that has previously been beneficial for her. She has a history of anger outbursts and has been using hydroxyzine to manage these episodes and to aid in sleep, expressing a desire to continue its use at bedtime. The medication used during the day has led to excessive drowsiness in the past and she would like to discuss other options. Her therapist mentioned to her a medication that can be taken as needed during the day that starts with a 'B' that should not make her as sleepy.   She has had an experience with Zoloft in the past, which unfortunately aggravated her depressive symptoms. She acknowledges understanding the necessity of daily escitalopram intake for the effective management of her depression and anxiety.  PMH, Medications, and Allergies reviewed and updated in chart as appropriate.   ROS negative except for what is listed in HPI. Objective:  BP 122/86   Pulse 78   Wt 183 lb 3.2 oz (83.1 kg)   BMI 27.05 kg/m  Physical Exam Vitals and nursing note reviewed.   Constitutional:      Appearance: Normal appearance.  HENT:     Head: Normocephalic.  Eyes:     Pupils: Pupils are equal, round, and reactive to light.  Cardiovascular:     Rate and Rhythm: Normal rate and regular rhythm.     Pulses: Normal pulses.  Pulmonary:     Effort: Pulmonary effort is normal.  Musculoskeletal:        General: Normal range of motion.     Cervical back: Normal range of motion.  Skin:    General: Skin is warm.  Neurological:     General: No focal deficit present.     Mental Status: She is alert and oriented to person, place, and time.  Psychiatric:        Mood and Affect: Mood normal.        Behavior: Behavior normal.        Thought Content: Thought content normal.        Judgment: Judgment normal.           Assessment & Plan:   Problem List Items Addressed This Visit     Depression - Primary    Patient reports worsening mood symptoms after being off escitalopram for six months. PHQ-9 is positive today with thoughts of self harm, but no intent or plan. Current sleep disturbances and anger outbursts are present and consistent with past depressive episodes. She is in therapy at this time twice to three times a week and plans to continue with this. No symptoms of risk of harm present at this time.  Plan: - Re-Initiate escitalopram at a dose of  10 mg daily. Liquid formulation provided as this is preferred by the patient.  - Educated the patient on the importance of consistent medication adherence and explain the mechanism of action.  - Encouraged establishing a routine for medication intake, such as keeping medication by cellphone charger for easy reminder to take medication.  - Restart hydroxyzine for sleep management as needed - Start buspar 35m up to three times a day as needed for anxiety, anger, or outburst management. - Educated on importance of keeping the medication with her at all times.  - Support continuation of therapy sessions and  collaboration with the therapist to address mental health concerns.        Relevant Medications   escitalopram (LEXAPRO) 5 MG/5ML solution   hydrOXYzine (ATARAX) 25 MG tablet   busPIRone (BUSPAR) 5 MG tablet   Time: 39 minutes, >50% spent counseling, care coordination, chart review, and documentation.     SOrma Render DNP, AGNP-c 06/14/2022  6:10 PM    History, Medications, Surgery, SDOH, and Family History reviewed and updated as appropriate.

## 2022-06-14 NOTE — Patient Instructions (Signed)
Dear Madison Wood,  Thank you for visiting [place] on Friday, June 14, 2022. Your dedication to your health is commendable, and we are here to support you every step of the way.  Please find below the treatment plan we discussed:  1. Resume taking escitalopram daily to maintain the necessary neurotransmitter levels in your brain.  2. Restart hydroxyzine to manage sleep  3. Initiate BuSpar (buspirone) at a starting dose of 5 milligrams, with the potential to increase to 10 milligrams as needed, up to three times a day for emotional control.  4. Develop a medication routine, such as carrying BuSpar in your purse or a pill case for as-needed use, and placing other medications by your phone charger as a reminder.  5. Maintain the frequency of counseling sessions as advised by your therapist to assist in mood management.  Please remember that it may take a few weeks to experience the full benefits of escitalopram, and adherence to the medication schedule is crucial. Should you have any questions or need further assistance, do not hesitate to contact our office.  Wishing you continued progress on your journey to wellness.  Warm regards,  SaraBeth Jerrit Horen, DNP, AGNP-c

## 2022-06-14 NOTE — Assessment & Plan Note (Signed)
Patient reports worsening mood symptoms after being off escitalopram for six months. PHQ-9 is positive today with thoughts of self harm, but no intent or plan. Current sleep disturbances and anger outbursts are present and consistent with past depressive episodes. She is in therapy at this time twice to three times a week and plans to continue with this. No symptoms of risk of harm present at this time.  Plan: - Re-Initiate escitalopram at a dose of 10 mg daily. Liquid formulation provided as this is preferred by the patient.  - Educated the patient on the importance of consistent medication adherence and explain the mechanism of action.  - Encouraged establishing a routine for medication intake, such as keeping medication by cellphone charger for easy reminder to take medication.  - Restart hydroxyzine for sleep management as needed - Start buspar 71m up to three times a day as needed for anxiety, anger, or outburst management. - Educated on importance of keeping the medication with her at all times.  - Support continuation of therapy sessions and collaboration with the therapist to address mental health concerns.

## 2022-06-17 DIAGNOSIS — F4010 Social phobia, unspecified: Secondary | ICD-10-CM | POA: Diagnosis not present

## 2022-06-17 DIAGNOSIS — F4312 Post-traumatic stress disorder, chronic: Secondary | ICD-10-CM | POA: Diagnosis not present

## 2022-06-25 DIAGNOSIS — F4312 Post-traumatic stress disorder, chronic: Secondary | ICD-10-CM | POA: Diagnosis not present

## 2022-06-25 DIAGNOSIS — F4010 Social phobia, unspecified: Secondary | ICD-10-CM | POA: Diagnosis not present

## 2022-07-03 DIAGNOSIS — F4312 Post-traumatic stress disorder, chronic: Secondary | ICD-10-CM | POA: Diagnosis not present

## 2022-07-03 DIAGNOSIS — F4010 Social phobia, unspecified: Secondary | ICD-10-CM | POA: Diagnosis not present

## 2022-07-09 DIAGNOSIS — F4010 Social phobia, unspecified: Secondary | ICD-10-CM | POA: Diagnosis not present

## 2022-07-09 DIAGNOSIS — F4312 Post-traumatic stress disorder, chronic: Secondary | ICD-10-CM | POA: Diagnosis not present

## 2022-07-10 DIAGNOSIS — F411 Generalized anxiety disorder: Secondary | ICD-10-CM | POA: Diagnosis not present

## 2022-07-10 DIAGNOSIS — F331 Major depressive disorder, recurrent, moderate: Secondary | ICD-10-CM | POA: Diagnosis not present

## 2022-07-12 DIAGNOSIS — F4312 Post-traumatic stress disorder, chronic: Secondary | ICD-10-CM | POA: Diagnosis not present

## 2022-07-12 DIAGNOSIS — F331 Major depressive disorder, recurrent, moderate: Secondary | ICD-10-CM | POA: Diagnosis not present

## 2022-07-12 DIAGNOSIS — F6381 Intermittent explosive disorder: Secondary | ICD-10-CM | POA: Diagnosis not present

## 2022-07-12 DIAGNOSIS — F411 Generalized anxiety disorder: Secondary | ICD-10-CM | POA: Diagnosis not present

## 2022-07-22 DIAGNOSIS — F4010 Social phobia, unspecified: Secondary | ICD-10-CM | POA: Diagnosis not present

## 2022-07-22 DIAGNOSIS — F4312 Post-traumatic stress disorder, chronic: Secondary | ICD-10-CM | POA: Diagnosis not present

## 2022-08-01 DIAGNOSIS — F4312 Post-traumatic stress disorder, chronic: Secondary | ICD-10-CM | POA: Diagnosis not present

## 2022-08-01 DIAGNOSIS — F4010 Social phobia, unspecified: Secondary | ICD-10-CM | POA: Diagnosis not present

## 2022-08-07 DIAGNOSIS — F6381 Intermittent explosive disorder: Secondary | ICD-10-CM | POA: Diagnosis not present

## 2022-08-07 DIAGNOSIS — F411 Generalized anxiety disorder: Secondary | ICD-10-CM | POA: Diagnosis not present

## 2022-08-07 DIAGNOSIS — F4312 Post-traumatic stress disorder, chronic: Secondary | ICD-10-CM | POA: Diagnosis not present

## 2022-08-07 DIAGNOSIS — Z1339 Encounter for screening examination for other mental health and behavioral disorders: Secondary | ICD-10-CM | POA: Diagnosis not present

## 2022-08-07 DIAGNOSIS — F331 Major depressive disorder, recurrent, moderate: Secondary | ICD-10-CM | POA: Diagnosis not present

## 2022-08-09 DIAGNOSIS — Z3042 Encounter for surveillance of injectable contraceptive: Secondary | ICD-10-CM | POA: Diagnosis not present

## 2022-08-16 DIAGNOSIS — F411 Generalized anxiety disorder: Secondary | ICD-10-CM | POA: Diagnosis not present

## 2022-08-16 DIAGNOSIS — F6381 Intermittent explosive disorder: Secondary | ICD-10-CM | POA: Diagnosis not present

## 2022-08-16 DIAGNOSIS — F331 Major depressive disorder, recurrent, moderate: Secondary | ICD-10-CM | POA: Diagnosis not present

## 2022-08-16 DIAGNOSIS — F4312 Post-traumatic stress disorder, chronic: Secondary | ICD-10-CM | POA: Diagnosis not present

## 2022-08-22 DIAGNOSIS — F4312 Post-traumatic stress disorder, chronic: Secondary | ICD-10-CM | POA: Diagnosis not present

## 2022-08-22 DIAGNOSIS — F4010 Social phobia, unspecified: Secondary | ICD-10-CM | POA: Diagnosis not present

## 2022-08-23 DIAGNOSIS — F4312 Post-traumatic stress disorder, chronic: Secondary | ICD-10-CM | POA: Diagnosis not present

## 2022-08-23 DIAGNOSIS — F411 Generalized anxiety disorder: Secondary | ICD-10-CM | POA: Diagnosis not present

## 2022-08-23 DIAGNOSIS — F6381 Intermittent explosive disorder: Secondary | ICD-10-CM | POA: Diagnosis not present

## 2022-08-23 DIAGNOSIS — F331 Major depressive disorder, recurrent, moderate: Secondary | ICD-10-CM | POA: Diagnosis not present

## 2022-08-30 ENCOUNTER — Encounter: Payer: Self-pay | Admitting: Nurse Practitioner

## 2022-08-30 ENCOUNTER — Ambulatory Visit: Payer: BC Managed Care – PPO | Admitting: Nurse Practitioner

## 2022-08-30 VITALS — BP 124/72 | HR 80 | Wt 182.0 lb

## 2022-08-30 DIAGNOSIS — F6381 Intermittent explosive disorder: Secondary | ICD-10-CM | POA: Diagnosis not present

## 2022-08-30 DIAGNOSIS — Z79899 Other long term (current) drug therapy: Secondary | ICD-10-CM

## 2022-08-30 DIAGNOSIS — F331 Major depressive disorder, recurrent, moderate: Secondary | ICD-10-CM | POA: Diagnosis not present

## 2022-08-30 DIAGNOSIS — F411 Generalized anxiety disorder: Secondary | ICD-10-CM

## 2022-08-30 DIAGNOSIS — F4312 Post-traumatic stress disorder, chronic: Secondary | ICD-10-CM | POA: Diagnosis not present

## 2022-08-30 DIAGNOSIS — F32A Depression, unspecified: Secondary | ICD-10-CM | POA: Diagnosis not present

## 2022-08-30 DIAGNOSIS — E559 Vitamin D deficiency, unspecified: Secondary | ICD-10-CM

## 2022-08-30 MED ORDER — VITAMIN D (ERGOCALCIFEROL) 1.25 MG (50000 UNIT) PO CAPS
50000.0000 [IU] | ORAL_CAPSULE | ORAL | 1 refills | Status: DC
Start: 2022-08-30 — End: 2023-05-27

## 2022-08-30 NOTE — Patient Instructions (Addendum)
Try the vitamin D for 2 weeks. If no difference then try changing the medication discussed below.   Try continue to hold the escitalopram for about 2 weeks and take the Buspirone twice a day scheduled and one additional dose if you need it for anxiety.   If the buspar doesn't work, let me know. You should know something around the 20th of May or that week.  If this doesn't work, I can plan to add Prozac in place of the escitalopram.   You can also try a B12 supplement. a day can be helpful for most people.

## 2022-08-30 NOTE — Progress Notes (Signed)
Shawna Clamp, DNP, AGNP-c [facility]  [address] [place], [place] [address] [phone]  ESTABLISHED PATIENT- Chronic Health and/or Follow-Up Visit  Blood pressure 124/72, pulse 80, weight 182 lb (82.6 kg).    Madison Wood is a 27 y.o. year old female presenting today for evaluation and management of chronic conditions.   The patient presents today for follow-up for mood. She expresses some increased fatigue, which she attributes to inconsistent use of her medication, escitalopram, over the past two weeks. She reports taking escitalopram in the morning but tried switching to bedtime as per her therapist's suggestion; however, this caused her to feel energized and disrupted her sleep schedule. The patient has been taking hydroxyzine and BuSpar as needed, with no issues reported.  The patient denies feeling sad or depressed and reports more symptoms related to anxiety, but admit these are better controlled. She has had trouble falling asleep or staying asleep nearly every day in the past two weeks and has felt tired or had little energy on several days. She denies any problems with overeating, poor appetite, feeling bad about herself, trouble concentrating, or thoughts of self-harm.  The patient has been attending therapy and has progressed from attending multiple sessions per week to now attending once a month.   All ROS negative with exception of what is listed above.   PHYSICAL EXAM Physical Exam Vitals and nursing note reviewed.  Constitutional:      Appearance: Normal appearance.  HENT:     Head: Normocephalic.  Eyes:     Pupils: Pupils are equal, round, and reactive to light.  Cardiovascular:     Rate and Rhythm: Normal rate and regular rhythm.     Pulses: Normal pulses.     Heart sounds: Normal heart sounds.  Pulmonary:     Effort: Pulmonary effort is normal.     Breath sounds: Normal breath sounds.  Musculoskeletal:        General: Normal  range of motion.     Cervical back: Normal range of motion.  Skin:    General: Skin is warm.  Neurological:     General: No focal deficit present.     Mental Status: She is alert and oriented to person, place, and time.  Psychiatric:        Mood and Affect: Mood normal.     PLAN Problem List Items Addressed This Visit     Depression - Primary    Improvement of depressive symptoms after starting escitalopram.  She has had increased fatigue recently, possibly related to restarting the medication however given the increase in her mood she is interested in.  Given the inconsistent use over the past 2 weeks we discussed trying the medication consistently to see if this helps with symptoms of fatigue and improves her mood.   Plan: Continue with escitalopram every morning.  Be sure to take daily to help reduce the risks of inconsistent symptom improvement. Will change buspirone to scheduled dosing of twice daily with additional dose as needed. May continue to use hydroxyzine at bedtime as needed.      Generalized anxiety disorder    Improvement of anxiety symptoms with restart of escitalopram and utilization of BuSpar.  She has not had consistent use over the past 2 weeks which has led to some continued anxiety symptoms.  We discussed medication management and options of transition if control is not achieved. Plan: - Continue escitalopram once daily. - Change buspirone to a scheduled dose twice daily with an additional  dose as needed - Consider switching to Prozac if buspirone/escitalopram are not effective in management. - Follow-up in 2 weeks.      Other Visit Diagnoses     Vitamin D deficiency       Relevant Medications   Vitamin D, Ergocalciferol, (DRISDOL) 1.25 MG (50000 UNIT) CAPS capsule   High risk medication use       Relevant Orders   Vitamin B12 (Completed)       Return in about 6 months (around 03/02/2023) for Med Management.   Shawna Clamp, DNP, AGNP-c 08/30/2022   8:49 AM

## 2022-08-31 LAB — VITAMIN B12: Vitamin B-12: 555 pg/mL (ref 232–1245)

## 2022-09-04 DIAGNOSIS — F331 Major depressive disorder, recurrent, moderate: Secondary | ICD-10-CM | POA: Diagnosis not present

## 2022-09-04 DIAGNOSIS — F4312 Post-traumatic stress disorder, chronic: Secondary | ICD-10-CM | POA: Diagnosis not present

## 2022-09-04 DIAGNOSIS — F6381 Intermittent explosive disorder: Secondary | ICD-10-CM | POA: Diagnosis not present

## 2022-09-04 DIAGNOSIS — F411 Generalized anxiety disorder: Secondary | ICD-10-CM | POA: Diagnosis not present

## 2022-09-06 DIAGNOSIS — F331 Major depressive disorder, recurrent, moderate: Secondary | ICD-10-CM | POA: Diagnosis not present

## 2022-09-06 DIAGNOSIS — F4312 Post-traumatic stress disorder, chronic: Secondary | ICD-10-CM | POA: Diagnosis not present

## 2022-09-06 DIAGNOSIS — F411 Generalized anxiety disorder: Secondary | ICD-10-CM | POA: Diagnosis not present

## 2022-09-06 DIAGNOSIS — F6381 Intermittent explosive disorder: Secondary | ICD-10-CM | POA: Diagnosis not present

## 2022-09-10 ENCOUNTER — Other Ambulatory Visit: Payer: Self-pay | Admitting: Nurse Practitioner

## 2022-09-15 NOTE — Assessment & Plan Note (Signed)
Improvement of depressive symptoms after starting escitalopram.  She has had increased fatigue recently, possibly related to restarting the medication however given the increase in her mood she is interested in.  Given the inconsistent use over the past 2 weeks we discussed trying the medication consistently to see if this helps with symptoms of fatigue and improves her mood.   Plan: Continue with escitalopram every morning.  Be sure to take daily to help reduce the risks of inconsistent symptom improvement. Will change buspirone to scheduled dosing of twice daily with additional dose as needed. May continue to use hydroxyzine at bedtime as needed.

## 2022-09-15 NOTE — Assessment & Plan Note (Signed)
Improvement of anxiety symptoms with restart of escitalopram and utilization of BuSpar.  She has not had consistent use over the past 2 weeks which has led to some continued anxiety symptoms.  We discussed medication management and options of transition if control is not achieved. Plan: - Continue escitalopram once daily. - Change buspirone to a scheduled dose twice daily with an additional dose as needed - Consider switching to Prozac if buspirone/escitalopram are not effective in management. - Follow-up in 2 weeks.

## 2022-09-16 DIAGNOSIS — F4312 Post-traumatic stress disorder, chronic: Secondary | ICD-10-CM | POA: Diagnosis not present

## 2022-09-16 DIAGNOSIS — F4010 Social phobia, unspecified: Secondary | ICD-10-CM | POA: Diagnosis not present

## 2022-09-20 DIAGNOSIS — F411 Generalized anxiety disorder: Secondary | ICD-10-CM | POA: Diagnosis not present

## 2022-09-20 DIAGNOSIS — F4312 Post-traumatic stress disorder, chronic: Secondary | ICD-10-CM | POA: Diagnosis not present

## 2022-09-20 DIAGNOSIS — F6381 Intermittent explosive disorder: Secondary | ICD-10-CM | POA: Diagnosis not present

## 2022-09-20 DIAGNOSIS — F331 Major depressive disorder, recurrent, moderate: Secondary | ICD-10-CM | POA: Diagnosis not present

## 2022-09-25 DIAGNOSIS — F4010 Social phobia, unspecified: Secondary | ICD-10-CM | POA: Diagnosis not present

## 2022-09-25 DIAGNOSIS — F4312 Post-traumatic stress disorder, chronic: Secondary | ICD-10-CM | POA: Diagnosis not present

## 2022-09-27 DIAGNOSIS — F6381 Intermittent explosive disorder: Secondary | ICD-10-CM | POA: Diagnosis not present

## 2022-09-27 DIAGNOSIS — F331 Major depressive disorder, recurrent, moderate: Secondary | ICD-10-CM | POA: Diagnosis not present

## 2022-09-27 DIAGNOSIS — F411 Generalized anxiety disorder: Secondary | ICD-10-CM | POA: Diagnosis not present

## 2022-09-27 DIAGNOSIS — F4312 Post-traumatic stress disorder, chronic: Secondary | ICD-10-CM | POA: Diagnosis not present

## 2022-10-01 DIAGNOSIS — F4010 Social phobia, unspecified: Secondary | ICD-10-CM | POA: Diagnosis not present

## 2022-10-01 DIAGNOSIS — F4312 Post-traumatic stress disorder, chronic: Secondary | ICD-10-CM | POA: Diagnosis not present

## 2022-10-02 DIAGNOSIS — F331 Major depressive disorder, recurrent, moderate: Secondary | ICD-10-CM | POA: Diagnosis not present

## 2022-10-02 DIAGNOSIS — F411 Generalized anxiety disorder: Secondary | ICD-10-CM | POA: Diagnosis not present

## 2022-10-02 DIAGNOSIS — F4312 Post-traumatic stress disorder, chronic: Secondary | ICD-10-CM | POA: Diagnosis not present

## 2022-10-02 DIAGNOSIS — F6381 Intermittent explosive disorder: Secondary | ICD-10-CM | POA: Diagnosis not present

## 2022-10-02 DIAGNOSIS — Z1339 Encounter for screening examination for other mental health and behavioral disorders: Secondary | ICD-10-CM | POA: Diagnosis not present

## 2022-10-04 ENCOUNTER — Encounter: Payer: Self-pay | Admitting: Nurse Practitioner

## 2022-10-04 DIAGNOSIS — F331 Major depressive disorder, recurrent, moderate: Secondary | ICD-10-CM | POA: Diagnosis not present

## 2022-10-04 DIAGNOSIS — F411 Generalized anxiety disorder: Secondary | ICD-10-CM | POA: Diagnosis not present

## 2022-10-04 DIAGNOSIS — F4312 Post-traumatic stress disorder, chronic: Secondary | ICD-10-CM | POA: Diagnosis not present

## 2022-10-04 DIAGNOSIS — F6381 Intermittent explosive disorder: Secondary | ICD-10-CM | POA: Diagnosis not present

## 2022-10-11 DIAGNOSIS — F4312 Post-traumatic stress disorder, chronic: Secondary | ICD-10-CM | POA: Diagnosis not present

## 2022-10-11 DIAGNOSIS — F411 Generalized anxiety disorder: Secondary | ICD-10-CM | POA: Diagnosis not present

## 2022-10-11 DIAGNOSIS — F331 Major depressive disorder, recurrent, moderate: Secondary | ICD-10-CM | POA: Diagnosis not present

## 2022-10-11 DIAGNOSIS — F6381 Intermittent explosive disorder: Secondary | ICD-10-CM | POA: Diagnosis not present

## 2022-10-21 ENCOUNTER — Encounter: Payer: Self-pay | Admitting: Family Medicine

## 2022-10-21 ENCOUNTER — Ambulatory Visit: Payer: BC Managed Care – PPO | Admitting: Family Medicine

## 2022-10-21 VITALS — BP 128/72 | HR 80 | Temp 98.4°F | Ht 69.0 in | Wt 181.2 lb

## 2022-10-21 DIAGNOSIS — Z118 Encounter for screening for other infectious and parasitic diseases: Secondary | ICD-10-CM | POA: Diagnosis not present

## 2022-10-21 DIAGNOSIS — Z113 Encounter for screening for infections with a predominantly sexual mode of transmission: Secondary | ICD-10-CM

## 2022-10-21 DIAGNOSIS — Z7251 High risk heterosexual behavior: Secondary | ICD-10-CM

## 2022-10-21 DIAGNOSIS — Z1159 Encounter for screening for other viral diseases: Secondary | ICD-10-CM

## 2022-10-21 NOTE — Progress Notes (Signed)
Chief Complaint  Patient presents with   STD CHECK    Patient is here for STD check, not currently having any symptoms and no known exposures. Some vaginal irritation just this am, maybe used something scented-but no discharge or odor. New partner in the last 2 weeks.     Patient presents for STD testing. Hasn't had testing in over a year. Denies any vaginal discharge, odor, itch. Denies sores, lesions, warts. She has slight irritation noted today only.  She uses a lot of perfumes/body scents. "Feels funny", no sores, redness, discharge  She has h/o chlamydia (college)  She has multiple partners, not using condoms. She has them with her, but "things move too quickly". Reports alcohol is involved. Has had 3 partners in the last 2 weeks.  Prior to 2 weeks ago, she was monogamous with her fiance.  She reports she is engaged to someone in [place], who is currently away. She called it an "open" relationship. He will be back in August.  On Depo Provera through GYN, last in April    PMH, [facility], [facility] reviewed  Immunizations reviewed: Unsure if she had Hep B vaccines (none documented in her chart)  Immunization History  Administered Date(s) Administered   DTaP 03/03/1997, 02/14/1998, 09/02/2000   HIB (PRP-OMP) 10/22/1996, 12/21/1996, 08/18/1997   HPV 9-valent 07/17/2020   Hepatitis A 08/29/2014   Hepatitis A, Adult 07/31/2020   Hpv-Unspecified 08/29/2014, 11/01/2014   IPV 10/22/1996, 12/21/1996, 08/18/1997, 09/02/2000   MMR 08/18/1997, 09/02/2000   Meningococcal B, OMV 08/29/2014, 11/01/2014   Meningococcal Conjugate 11/06/2007, 08/29/2014   PFIZER Comirnaty(Gray Top)Covid-19 Tri-Sucrose Vaccine 07/03/2020   PFIZER(Purple Top)SARS-COV-2 Vaccination 09/15/2019, 10/05/2019   Td 11/06/2007, 11/26/2017   Tdap 11/06/2007, 11/26/2017   Varicella 08/18/1997    Outpatient Encounter Medications as of 10/21/2022  Medication Sig   medroxyPROGESTERone (DEPO-PROVERA) 150 MG/ML injection  medroxyprogesterone 150 mg/mL intramuscular suspension   busPIRone (BUSPAR) 5 MG tablet Take 1 tablet (5 mg total) by mouth 3 (three) times daily. (Patient not taking: Reported on 10/21/2022)   escitalopram (LEXAPRO) 5 MG/5ML solution Take 10 mLs (10 mg total) by mouth daily. (Patient not taking: Reported on 10/21/2022)   hydrOXYzine (ATARAX) 25 MG tablet Take 1 tablet (25 mg total) by mouth at bedtime and may repeat dose one time if needed. For sleep and anxiety (Patient not taking: Reported on 10/21/2022)   Vitamin D, Ergocalciferol, (DRISDOL) 1.25 MG (50000 UNIT) CAPS capsule Take 1 capsule (50,000 Units total) by mouth every 7 (seven) days. (Patient not taking: Reported on 10/21/2022)   No facility-administered encounter medications on file as of 10/21/2022.   No Known Allergies  ROS: no fever, chills, pelvic pain, vaginal discharge, abnormal bleeding, no lesions or rashes.   PHYSICAL EXAM:  BP 128/72   Pulse 80   Temp 98.4 F (36.9 C) (Tympanic)   Ht 5\' 9"  (1.753 m)   Wt 181 lb 3.2 oz (82.2 kg)   BMI 26.76 kg/m   Well-appearing, pleasant female. In discussing her recent change in sexual activity, she mentioned she thinks it is "replacing" something.  Realized she needs to talk more to therapist about this. She had full range of affect. She was alert, oriented, cranial nerves intact.  Normal eye contact, speech, hygiene and grooming   ASSESSMENT/PLAN:  Screen for STD (sexually transmitted disease) - Reviewed what was being screened, and what wasn't, and why (HSV, trich). Return if change in symptoms (discharge, lesion, worsening irritation) for exam - Plan: HIV Antibody (routine testing w  rflx), GC/Chlamydia Probe Amp, Hepatitis B surface antigen, Hepatitis C antibody, RPR  Unprotected sex - Counseled in detail re: safe sex, STD risks, choosing partners wisely. Encouraged f/u with therapist. Change in behavior x2 weeks only.  Screening for viral and chlamydial diseases - since no  record of HepB, will check titer. If non-immune, recommended she get HepliSav-B - Plan: HIV Antibody (routine testing w rflx), GC/Chlamydia Probe Amp, Hepatitis B surface antigen, Hepatitis C antibody, Hepatitis B surface antibody,qualitative   GC/chlamydia HIV, RPR HepB SAb--if not immune, recommend vaccination.  HepB SAg, Hep C Ab   [facility] [address]

## 2022-10-21 NOTE — Patient Instructions (Signed)
If you have any worsening of the slight irritation that just started today, or if you notice any abnormal vaginal discharge, or sore, or other concerns, you can return for re-evaluation, including a full pelvic exam.  We are screening you to see if you are immune to hepatitis B, since I didn't see those vaccines in your immunization record. If you are NOT immune, then vaccination is recommended.  You can do this at our office (Heplisav-B, series of 2 vaccines, a month apart).

## 2022-10-22 LAB — HIV ANTIBODY (ROUTINE TESTING W REFLEX): HIV Screen 4th Generation wRfx: NONREACTIVE

## 2022-10-22 LAB — HEPATITIS B SURFACE ANTIGEN: Hepatitis B Surface Ag: NEGATIVE

## 2022-10-22 LAB — RPR: RPR Ser Ql: NONREACTIVE

## 2022-10-22 LAB — HEPATITIS B SURFACE ANTIBODY,QUALITATIVE: Hep B Surface Ab, Qual: NONREACTIVE

## 2022-10-22 LAB — HEPATITIS C ANTIBODY: Hep C Virus Ab: NONREACTIVE

## 2022-10-23 LAB — GC/CHLAMYDIA PROBE AMP
Chlamydia trachomatis, NAA: NEGATIVE
Neisseria Gonorrhoeae by PCR: NEGATIVE

## 2022-10-25 DIAGNOSIS — F6381 Intermittent explosive disorder: Secondary | ICD-10-CM | POA: Diagnosis not present

## 2022-10-25 DIAGNOSIS — F4312 Post-traumatic stress disorder, chronic: Secondary | ICD-10-CM | POA: Diagnosis not present

## 2022-10-25 DIAGNOSIS — Z3042 Encounter for surveillance of injectable contraceptive: Secondary | ICD-10-CM | POA: Diagnosis not present

## 2022-10-25 DIAGNOSIS — F411 Generalized anxiety disorder: Secondary | ICD-10-CM | POA: Diagnosis not present

## 2022-10-25 DIAGNOSIS — F331 Major depressive disorder, recurrent, moderate: Secondary | ICD-10-CM | POA: Diagnosis not present

## 2022-11-08 DIAGNOSIS — F411 Generalized anxiety disorder: Secondary | ICD-10-CM | POA: Diagnosis not present

## 2022-11-08 DIAGNOSIS — F4312 Post-traumatic stress disorder, chronic: Secondary | ICD-10-CM | POA: Diagnosis not present

## 2022-11-08 DIAGNOSIS — F331 Major depressive disorder, recurrent, moderate: Secondary | ICD-10-CM | POA: Diagnosis not present

## 2022-11-08 DIAGNOSIS — F6381 Intermittent explosive disorder: Secondary | ICD-10-CM | POA: Diagnosis not present

## 2022-11-15 ENCOUNTER — Encounter: Payer: Self-pay | Admitting: Nurse Practitioner

## 2022-11-15 DIAGNOSIS — F4312 Post-traumatic stress disorder, chronic: Secondary | ICD-10-CM | POA: Diagnosis not present

## 2022-11-15 DIAGNOSIS — F411 Generalized anxiety disorder: Secondary | ICD-10-CM | POA: Diagnosis not present

## 2022-11-15 DIAGNOSIS — F6381 Intermittent explosive disorder: Secondary | ICD-10-CM | POA: Diagnosis not present

## 2022-11-15 DIAGNOSIS — F331 Major depressive disorder, recurrent, moderate: Secondary | ICD-10-CM | POA: Diagnosis not present

## 2022-11-29 DIAGNOSIS — F411 Generalized anxiety disorder: Secondary | ICD-10-CM | POA: Diagnosis not present

## 2022-11-29 DIAGNOSIS — F4312 Post-traumatic stress disorder, chronic: Secondary | ICD-10-CM | POA: Diagnosis not present

## 2022-11-29 DIAGNOSIS — F6381 Intermittent explosive disorder: Secondary | ICD-10-CM | POA: Diagnosis not present

## 2022-11-29 DIAGNOSIS — F331 Major depressive disorder, recurrent, moderate: Secondary | ICD-10-CM | POA: Diagnosis not present

## 2022-12-06 DIAGNOSIS — F411 Generalized anxiety disorder: Secondary | ICD-10-CM | POA: Diagnosis not present

## 2022-12-06 DIAGNOSIS — F4312 Post-traumatic stress disorder, chronic: Secondary | ICD-10-CM | POA: Diagnosis not present

## 2022-12-06 DIAGNOSIS — F331 Major depressive disorder, recurrent, moderate: Secondary | ICD-10-CM | POA: Diagnosis not present

## 2022-12-06 DIAGNOSIS — F6381 Intermittent explosive disorder: Secondary | ICD-10-CM | POA: Diagnosis not present

## 2022-12-13 DIAGNOSIS — F411 Generalized anxiety disorder: Secondary | ICD-10-CM | POA: Diagnosis not present

## 2022-12-13 DIAGNOSIS — F6381 Intermittent explosive disorder: Secondary | ICD-10-CM | POA: Diagnosis not present

## 2022-12-13 DIAGNOSIS — F4312 Post-traumatic stress disorder, chronic: Secondary | ICD-10-CM | POA: Diagnosis not present

## 2022-12-13 DIAGNOSIS — F331 Major depressive disorder, recurrent, moderate: Secondary | ICD-10-CM | POA: Diagnosis not present

## 2022-12-27 DIAGNOSIS — F4312 Post-traumatic stress disorder, chronic: Secondary | ICD-10-CM | POA: Diagnosis not present

## 2022-12-27 DIAGNOSIS — F331 Major depressive disorder, recurrent, moderate: Secondary | ICD-10-CM | POA: Diagnosis not present

## 2022-12-27 DIAGNOSIS — F6381 Intermittent explosive disorder: Secondary | ICD-10-CM | POA: Diagnosis not present

## 2022-12-27 DIAGNOSIS — F411 Generalized anxiety disorder: Secondary | ICD-10-CM | POA: Diagnosis not present

## 2023-01-03 DIAGNOSIS — F331 Major depressive disorder, recurrent, moderate: Secondary | ICD-10-CM | POA: Diagnosis not present

## 2023-01-03 DIAGNOSIS — F6381 Intermittent explosive disorder: Secondary | ICD-10-CM | POA: Diagnosis not present

## 2023-01-03 DIAGNOSIS — F411 Generalized anxiety disorder: Secondary | ICD-10-CM | POA: Diagnosis not present

## 2023-01-03 DIAGNOSIS — F4312 Post-traumatic stress disorder, chronic: Secondary | ICD-10-CM | POA: Diagnosis not present

## 2023-01-10 DIAGNOSIS — F6381 Intermittent explosive disorder: Secondary | ICD-10-CM | POA: Diagnosis not present

## 2023-01-10 DIAGNOSIS — F331 Major depressive disorder, recurrent, moderate: Secondary | ICD-10-CM | POA: Diagnosis not present

## 2023-01-10 DIAGNOSIS — F411 Generalized anxiety disorder: Secondary | ICD-10-CM | POA: Diagnosis not present

## 2023-01-10 DIAGNOSIS — F4312 Post-traumatic stress disorder, chronic: Secondary | ICD-10-CM | POA: Diagnosis not present

## 2023-01-14 DIAGNOSIS — Z3009 Encounter for other general counseling and advice on contraception: Secondary | ICD-10-CM | POA: Diagnosis not present

## 2023-01-14 DIAGNOSIS — Z30013 Encounter for initial prescription of injectable contraceptive: Secondary | ICD-10-CM | POA: Diagnosis not present

## 2023-01-14 DIAGNOSIS — Z3042 Encounter for surveillance of injectable contraceptive: Secondary | ICD-10-CM | POA: Diagnosis not present

## 2023-01-15 ENCOUNTER — Encounter: Payer: Self-pay | Admitting: Nurse Practitioner

## 2023-01-17 DIAGNOSIS — F411 Generalized anxiety disorder: Secondary | ICD-10-CM | POA: Diagnosis not present

## 2023-01-17 DIAGNOSIS — F6381 Intermittent explosive disorder: Secondary | ICD-10-CM | POA: Diagnosis not present

## 2023-01-17 DIAGNOSIS — F4312 Post-traumatic stress disorder, chronic: Secondary | ICD-10-CM | POA: Diagnosis not present

## 2023-01-17 DIAGNOSIS — F331 Major depressive disorder, recurrent, moderate: Secondary | ICD-10-CM | POA: Diagnosis not present

## 2023-03-03 DIAGNOSIS — Z713 Dietary counseling and surveillance: Secondary | ICD-10-CM | POA: Diagnosis not present

## 2023-03-04 ENCOUNTER — Encounter: Payer: Self-pay | Admitting: Medical

## 2023-03-04 ENCOUNTER — Ambulatory Visit: Payer: BC Managed Care – PPO | Admitting: Medical

## 2023-03-04 VITALS — BP 126/74 | HR 99 | Temp 98.9°F | Wt 187.2 lb

## 2023-03-04 DIAGNOSIS — Z23 Encounter for immunization: Secondary | ICD-10-CM | POA: Diagnosis not present

## 2023-03-04 DIAGNOSIS — L732 Hidradenitis suppurativa: Secondary | ICD-10-CM | POA: Diagnosis not present

## 2023-03-04 MED ORDER — DOXYCYCLINE HYCLATE 100 MG PO TABS: 100.0000 mg | ORAL_TABLET | Freq: Two times a day (BID) | ORAL | 0 refills | Status: DC

## 2023-03-04 MED ORDER — BENZOYL PEROXIDE-ERYTHROMYCIN 5-3 % EX GEL: Freq: Two times a day (BID) | CUTANEOUS | 1 refills | Status: DC

## 2023-03-04 NOTE — Progress Notes (Signed)
Subjective:  Madison Wood is a 27 y.o. female who presents for Chief Complaint  Patient presents with   Ingrown Hair    Left underarm. 5 weeks. Painful and tender. Thinks its from her spray on deodorant   Immunizations    Flu and covid      Here for tender area of lumps in her left armpit.  Started within the past month and not getting better.  No prior similar.  No history of hidradenitis.  No family history of hidradenitis either.  Otherwise normal state of health.  No drainage.  No fever.  No body aches or chills.  She would like a flu and COVID-vaccine as well  No other aggravating or relieving factors.    No other c/o.  Past Medical History:  Diagnosis Date   Depression    Current Outpatient Medications on File Prior to Visit  Medication Sig Dispense Refill   medroxyPROGESTERone (DEPO-PROVERA) 150 MG/ML injection medroxyprogesterone 150 mg/mL intramuscular suspension     busPIRone (BUSPAR) 5 MG tablet Take 1 tablet (5 mg total) by mouth 3 (three) times daily. (Patient not taking: Reported on 10/21/2022) 90 tablet 6   escitalopram (LEXAPRO) 5 MG/5ML solution Take 10 mLs (10 mg total) by mouth daily. (Patient not taking: Reported on 10/21/2022) 240 mL 6   hydrOXYzine (ATARAX) 25 MG tablet Take 1 tablet (25 mg total) by mouth at bedtime and may repeat dose one time if needed. For sleep and anxiety (Patient not taking: Reported on 10/21/2022) 60 tablet 6   Vitamin D, Ergocalciferol, (DRISDOL) 1.25 MG (50000 UNIT) CAPS capsule Take 1 capsule (50,000 Units total) by mouth every 7 (seven) days. (Patient not taking: Reported on 10/21/2022) 12 capsule 1   No current facility-administered medications on file prior to visit.     The following portions of the patient's history were reviewed and updated as appropriate: allergies, current medications, past family history, past medical history, past social history, past surgical history and problem list.  ROS Otherwise as in subjective  above  Objective: BP 126/74   Pulse 99   Temp 98.9 F (37.2 C)   Wt 187 lb 3.2 oz (84.9 kg)   SpO2 96%   BMI 27.64 kg/m   General appearance: alert, no distress, well developed, well nourished Skin: Nodular tender indurated area in the left axilla this approximately 3 cm x 1 cm consistent with hidradenitis.  No pus drainage.  No fluctuance.  No warmth.  Right axilla unremarkable.   Assessment: Encounter Diagnoses  Name Primary?   Hidradenitis axillaris Yes   Need for influenza vaccination    Need for COVID-19 vaccine      Plan: We discussed findings, possible causes, discussed possible treatment and possible recurrence.  Begin topical medication below and if not improving within 48 hours then add the oral antibiotic.  Hold off on deodorant on the left side for the next week or so until this resolves.  Recheck if this continues to be an issue going forward.  Get on the schedule for fasting physical in the near future.  Counseled on the influenza virus vaccine.  Vaccine information sheet given.  Influenza vaccine given after consent obtained.  Counseled on the Covid virus vaccine.  Vaccine information sheet given.  Covid vaccine given after consent obtained.   Madison Wood was seen today for ingrown hair and immunizations.  Diagnoses and all orders for this visit:  Hidradenitis axillaris  Need for influenza vaccination -     Flu vaccine trivalent PF,  6mos and older(Flulaval,Afluria,Fluarix,Fluzone)  Need for COVID-19 vaccine -     Pfizer Comirnaty Covid -19 Vaccine 11yrs and older  Other orders -     doxycycline (VIBRA-TABS) 100 MG tablet; Take 1 tablet (100 mg total) by mouth 2 (two) times daily. -     benzoyl peroxide-erythromycin (BENZAMYCIN) gel; Apply topically 2 (two) times daily.    Follow up: prn

## 2023-03-04 NOTE — Patient Instructions (Signed)
Hidradenitis Suppurativa Hidradenitis suppurativa is a long-term (chronic) skin disease. It is similar to a severe form of acne, but it affects areas of the body where acne would be unusual, especially areas of the body where skin rubs against skin and becomes moist. These include: Underarms. Groin. Genital area. Buttocks. Upper thighs. Breasts. Hidradenitis suppurativa may start out as small lumps or pimples caused by blocked skin pores, sweat glands, or hair follicles. Pimples may develop into deep sores that break open (rupture) and drain pus. Over time, affected areas of skin may thicken and become scarred. This condition is rare and does not spread from person to person (non-contagious). What are the causes? The exact cause of this condition is not known. It may be related to: Female and female hormones. An overactive disease-fighting system (immune system). The immune system may over-react to blocked hair follicles or sweat glands and cause swelling and pus-filled sores. What increases the risk? You are more likely to develop this condition if you: Are female. Are 11-55 years old. Have a family history of hidradenitis suppurativa. Have a personal history of acne. Are overweight. Smoke. Take the medicine lithium. What are the signs or symptoms? The first symptoms are usually painful bumps in the skin, similar to pimples. The condition may get worse over time (progress), or it may only cause mild symptoms. If the disease progresses, symptoms may include: Skin bumps getting bigger and growing deeper into the skin. Bumps rupturing and draining pus. Itchy, infected skin. Skin getting thicker and scarred. Tunnels under the skin (fistulas) where pus drains from a bump. Pain during daily activities, such as pain during walking if your groin area is affected. Emotional problems, such as stress or depression. This condition may affect your appearance and your ability or willingness to wear  certain clothes or do certain activities. How is this diagnosed? This condition is diagnosed by a health care provider who specializes in skin conditions (dermatologist). You may be diagnosed based on: Your symptoms and medical history. A physical exam. Testing a pus sample for infection. Blood tests. How is this treated? Your treatment will depend on how severe your symptoms are. The same treatment will not work for everybody with this condition. You may need to try several treatments to find what works best for you. Treatment may include: Cleaning and bandaging (dressing) your wounds as needed. Lifestyle changes, such as new skin care routines. Taking medicines, such as: Antibiotics. Acne medicines. Medicines to reduce the activity of the immune system. A diabetes medicine (metformin). Birth control pills, for women. Steroids to reduce swelling and pain. Working with a mental health care provider, if you experience emotional distress due to this condition. If you have severe symptoms that do not get better with medicine, you may need surgery. Surgery may involve: Using a laser to clear the skin and remove hair follicles. Opening and draining deep sores. Removing the areas of skin that are diseased and scarred. Follow these instructions at home: Medicines  Take over-the-counter and prescription medicines only as told by your health care provider. If you were prescribed antibiotics, take them as told by your health care provider. Do not stop using the antibiotic even if your condition improves. Skin care If you have open wounds, cover them with a clean dressing as told by your health care provider. Keep wounds clean by washing them gently with soap and water when you bathe. Do not shave the areas where you get hidradenitis suppurativa. Wear loose-fitting clothes. Try to avoid   getting overheated or sweaty. If you get sweaty or wet, change into clean, dry clothes as soon as you can. To  help relieve pain and itchiness, cover sore areas with a warm, clean washcloth (warm compress) for 5-10 minutes as often as needed. Your healthcare provider may recommend an antiperspirant deodorant that may be gentle on your skin. A daily antiseptic wash to cleanse affected areas may be suggested by your healthcare provider. General instructions Learn as much as you can about your disease so that you have an active role in your treatment. Work closely with your health care provider to find treatments that work for you. If you are overweight, work with your health care provider to lose weight as recommended. Do not use any products that contain nicotine or tobacco. These products include cigarettes, chewing tobacco, and vaping devices, such as e-cigarettes. If you need help quitting, ask your health care provider. If you struggle with living with this condition, talk with your health care provider or work with a mental health care provider as recommended. Keep all follow-up visits. Where to find more information Hidradenitis Suppurativa Foundation[employer] Inc.: www.hs-foundation.org [employer]atology: www.aad.org Contact a health care provider if: You have a flare-up of hidradenitis suppurativa. You have a fever or chills. You have trouble controlling your symptoms at home. You have trouble doing your daily activities because of your symptoms. You have trouble dealing with emotional problems related to your condition. Summary Hidradenitis suppurativa is a long-term (chronic) skin disease. It is similar to a severe form of acne, but it affects areas of the body where acne would be unusual. The first symptoms are usually painful bumps in the skin, similar to pimples. The condition may only cause mild symptoms, or it may get worse over time (progress). If you have open wounds, cover them with a clean dressing as told by your health care provider. Keep wounds clean by washing them gently with  soap and water when you bathe. Besides skin care, treatment may include medicines, laser treatment, and surgery. This information is not intended to replace advice given to you by your health care provider. Make sure you discuss any questions you have with your health care provider. Document Revised: 06/06/2021 Document Reviewed: 06/06/2021 Elsevier Patient Education  2024 [employer].  

## 2023-03-07 ENCOUNTER — Encounter: Payer: BC Managed Care – PPO | Admitting: Nurse Practitioner

## 2023-03-11 DIAGNOSIS — Z713 Dietary counseling and surveillance: Secondary | ICD-10-CM | POA: Diagnosis not present

## 2023-03-20 DIAGNOSIS — Z713 Dietary counseling and surveillance: Secondary | ICD-10-CM | POA: Diagnosis not present

## 2023-03-24 ENCOUNTER — Ambulatory Visit: Payer: BC Managed Care – PPO | Admitting: Medical

## 2023-03-24 VITALS — BP 120/80 | HR 80 | Temp 98.0°F | Wt 192.6 lb

## 2023-03-24 DIAGNOSIS — L732 Hidradenitis suppurativa: Secondary | ICD-10-CM

## 2023-03-24 DIAGNOSIS — R350 Frequency of micturition: Secondary | ICD-10-CM | POA: Diagnosis not present

## 2023-03-24 LAB — POCT URINALYSIS DIP (PROADVANTAGE DEVICE)
Bilirubin, UA: NEGATIVE
Blood, UA: NEGATIVE
Glucose, UA: NEGATIVE mg/dL
Ketones, POC UA: NEGATIVE mg/dL
Leukocytes, UA: NEGATIVE
Nitrite, UA: NEGATIVE
Protein Ur, POC: NEGATIVE mg/dL
Specific Gravity, Urine: 1.015
Urobilinogen, Ur: NEGATIVE
pH, UA: 7 (ref 5.0–8.0)

## 2023-03-24 LAB — POCT WET PREP (WET MOUNT)

## 2023-03-24 MED ORDER — CHLORHEXIDINE GLUCONATE 4 % EX SOLN: Freq: Every day | CUTANEOUS | 1 refills | Status: DC | PRN

## 2023-03-24 MED ORDER — FLUCONAZOLE 150 MG PO TABS: 150.0000 mg | ORAL_TABLET | Freq: Once | ORAL | 0 refills | Status: AC

## 2023-03-24 MED ORDER — DOXYCYCLINE HYCLATE 100 MG PO TABS: 100.0000 mg | ORAL_TABLET | Freq: Every day | ORAL | 0 refills | Status: DC

## 2023-03-24 NOTE — Progress Notes (Unsigned)
Subjective:  Madison Wood is a 27 y.o. female who presents for Chief Complaint  Patient presents with   Follow-up    Follow-up on under arm issues. One of the boils popped this morning. Pt feels she has a UTI but I advised her we wouldn't not have time for that and she would need to schedule another visit.      Here for recheck on left arm hidradenitis.  I saw her about 20 days ago for flare up in the left armpit.  She started out with the BenzaClin gel but started getting redness and irritation of the armpit.  She did this for the first week or so then stop the BenzaClin gel.  She then began the doxycycline oral antibiotic which seemed to help.  As of 3 days ago was still quite irritated and swollen but then this morning there was a rupture and pus came out.  She feels like the area is starting to improve   She also has some new urinary urgency and burning.  No concern for STD.  No sexual activity in a while.  Last UTI has been a long time ago.  No vaginal discharge or itching.  No redness.  No other aggravating or relieving factors.    No other c/o.  Past Medical History:  Diagnosis Date   Depression    Current Outpatient Medications on File Prior to Visit  Medication Sig Dispense Refill   busPIRone (BUSPAR) 5 MG tablet Take 1 tablet (5 mg total) by mouth 3 (three) times daily. 90 tablet 6   escitalopram (LEXAPRO) 5 MG/5ML solution Take 10 mLs (10 mg total) by mouth daily. 240 mL 6   hydrOXYzine (ATARAX) 25 MG tablet Take 1 tablet (25 mg total) by mouth at bedtime and may repeat dose one time if needed. For sleep and anxiety 60 tablet 6   medroxyPROGESTERone (DEPO-PROVERA) 150 MG/ML injection medroxyprogesterone 150 mg/mL intramuscular suspension     Vitamin D, Ergocalciferol, (DRISDOL) 1.25 MG (50000 UNIT) CAPS capsule Take 1 capsule (50,000 Units total) by mouth every 7 (seven) days. 12 capsule 1   No current facility-administered medications on file prior to visit.      The following portions of the patient's history were reviewed and updated as appropriate: allergies, current medications, past family history, past medical history, past social history, past surgical history and problem list.  ROS Otherwise as in subjective above    Objective: BP 120/80   Pulse 80   Temp 98 F (36.7 C)   Wt 192 lb 9.6 oz (87.4 kg)   BMI 28.44 kg/m   General appearance: alert, no distress, well developed, well nourished Skin: her photo from 3 days ago still shows 2 areas of nodular area in the left axilla approximately 3 cm x 1 cm consistent with hidradenitis along with surrounding redness of superior left axilla.  Currently there is soiled pus on undershirt from drainage, left posterior axillary nodule has decreased in size but still has tender anterior area of nodularity.  No pus drainage.  No fluctuance.  No warmth.   GU - declined    Assessment: Encounter Diagnoses  Name Primary?   Hidradenitis axillaris Yes   Urinary frequency       Plan: Urinalysis negative Wet prep shows few yeast  Yeast vaginitis secondary to recent oral antibiotic use Begin Diflucan weekly x 1 or 2 doses, eat some yogurt regular probiotic daily for the next 1 to 2 weeks  Hidradenitis axillaris, left Change to  doxycycline once daily for 10 to 20 days or sooner if the arm pit lesion completely clears up  Hold off on deodorant in the left axilla for the time being  Begin Hibiclens body wash once or twice a week in the axilla  Discontinue BenzaClin gel due to irritation reaction  Madison Wood was seen today for follow-up.  Diagnoses and all orders for this visit:  Hidradenitis axillaris  Urinary frequency -     POCT Urinalysis DIP (Proadvantage Device) -     POCT Wet Prep [facility])  Other orders -     chlorhexidine (HIBICLENS) 4 % external liquid; Apply topically daily as needed. -     fluconazole (DIFLUCAN) 150 MG tablet; Take 1 tablet (150 mg total) by mouth once  for 1 dose. May repeat in 1 week -     doxycycline (VIBRA-TABS) 100 MG tablet; Take 1 tablet (100 mg total) by mouth daily.    Follow up: call or recheck in 2 weeks

## 2023-04-03 DIAGNOSIS — Z3042 Encounter for surveillance of injectable contraceptive: Secondary | ICD-10-CM | POA: Diagnosis not present

## 2023-04-04 DIAGNOSIS — Z713 Dietary counseling and surveillance: Secondary | ICD-10-CM | POA: Diagnosis not present

## 2023-05-02 DIAGNOSIS — Z713 Dietary counseling and surveillance: Secondary | ICD-10-CM | POA: Diagnosis not present

## 2023-05-13 ENCOUNTER — Ambulatory Visit: Payer: BLUE CROSS/BLUE SHIELD | Admitting: Nurse Practitioner

## 2023-05-13 VITALS — BP 118/76 | HR 68 | Ht 69.5 in | Wt 190.8 lb

## 2023-05-13 DIAGNOSIS — E559 Vitamin D deficiency, unspecified: Secondary | ICD-10-CM | POA: Insufficient documentation

## 2023-05-13 DIAGNOSIS — Z0000 Encounter for general adult medical examination without abnormal findings: Secondary | ICD-10-CM

## 2023-05-13 DIAGNOSIS — F411 Generalized anxiety disorder: Secondary | ICD-10-CM | POA: Diagnosis not present

## 2023-05-13 DIAGNOSIS — L732 Hidradenitis suppurativa: Secondary | ICD-10-CM | POA: Insufficient documentation

## 2023-05-13 DIAGNOSIS — K5904 Chronic idiopathic constipation: Secondary | ICD-10-CM

## 2023-05-13 DIAGNOSIS — Z3009 Encounter for other general counseling and advice on contraception: Secondary | ICD-10-CM

## 2023-05-13 DIAGNOSIS — F5104 Psychophysiologic insomnia: Secondary | ICD-10-CM | POA: Diagnosis not present

## 2023-05-13 MED ORDER — TRAZODONE HCL 50 MG PO TABS: 25.0000 mg | ORAL_TABLET | Freq: Every evening | ORAL | 6 refills | Status: DC | PRN

## 2023-05-13 NOTE — Assessment & Plan Note (Signed)
 Currently on Depo-Provera  and considering other birth control options due to concerns about long-term effects on fertility. Discussed various options including oral contraceptives, Nexplanon, and IUDs, along with their benefits and potential downsides. Advised that oral contraceptives have a higher risk of failure due to missed doses.  Discussed that longer acting options do have the potential to delay fertility, but there is no definitive time frame.  - Consider switching to oral contraceptives or Nexplanon if planning pregnancy in the next two years   - Consult with OBGYN for further guidance on birth control options

## 2023-05-13 NOTE — Patient Instructions (Addendum)
 Lets try the Trazodone  to see how this does with helping you shut your mind off to sleep. You can start with 1/2 tab and increase if you need to to a full tab. If you feel groggy the next day, you can try taking it a little earlier in the evening or you can discontinue it.  If you want to restart on your Buspar  or Lexapro  let me know and I am happy to send those in for you.   I will let you know if your labs show anything concerning.    You can use something like Colace or Miralax to help if you feel like you are getting constipated. These are ok to use regularly if you need them.  Increased fiber in your diet is very important to help keep your bowels regular. You can consider using something like chia seeds to help. Once a day can make a really big difference in helping with bowel movements.

## 2023-05-13 NOTE — Progress Notes (Signed)
 Catheline Doing, DNP, AGNP-c [facility] [address] [place], [place] [address] Main Office [phone]  BP 118/76   Pulse 68   Ht 5' 9.5 (1.765 m)   Wt 190 lb 12.8 oz (86.5 kg)   BMI 27.77 kg/m    Subjective:    Patient ID: Madison Wood, female    DOB: December 09, 1995, 28 y.o.   MRN: [id number]  HPI: Madison Wood is a 28 y.o. female presenting on 05/13/2023 for comprehensive medical examination.   History of Present Illness Brynna expresses concerns about a previously noted underarm bump. She reports that the bump has improved significantly and is no longer painful, but she can still feel a residual knot-like scar. She has resumed normal activities, including the use of deodorant. She questions if shaving is appropriate at this time.   The patient also reports recent improvements in her mood, despite ongoing stressors. She had previously been engaged in individual and couples therapy but had discontinued these services due to scheduling conflicts. She expresses an intention to resume individual therapy.  Sleep disturbances are a significant concern for the patient. She reports difficulty in turning off her mind at night, despite implementing a nighttime routine and creating a conducive sleep environment. She has been using hydroxyzine  for anxiety and sleep, but reports that it leaves her feeling excessively drowsy the following day. She has also been prescribed Buspar , but has not been taking it regularly. She has stopped the lexapro .   The patient reports a recent episode of severe constipation, which resolved spontaneously. She has been advised to increase her water intake and consider using a stool softener if needed.    She also mentions occasional painful white bumps on the side of her tongue, which come and go without any identifiable triggers.  The patient is currently on Depo-Provera  for birth control and is considering changing to a different method  in the next two years due to concerns about potential fertility issues after discontinuation. She expresses a desire to start a family in approximately two years.  Pertinent items are noted in HPI.   Most Recent Depression Screen:     05/13/2023   10:31 AM 08/30/2022    9:18 AM 06/14/2022    9:57 AM 05/18/2020    8:57 AM  Depression screen PHQ 2/9  Decreased Interest 0 0 1 0  Down, Depressed, Hopeless 0 0 3 0  PHQ - 2 Score 0 0 4 0  Altered sleeping  3 3   Tired, decreased energy  1 3   Change in appetite  0 1   Feeling bad or failure about yourself   0 3   Trouble concentrating  0 3   Moving slowly or fidgety/restless  0 0   Suicidal thoughts  0 3   PHQ-9 Score  4 20   Difficult doing work/chores  Somewhat difficult Very difficult    Most Recent Anxiety Screen:     08/30/2022    9:20 AM  GAD 7 : Generalized Anxiety Score  Nervous, Anxious, on Edge 0  Control/stop worrying 1  Worry too much - different things 1  Trouble relaxing 0  Restless 0  Easily annoyed or irritable 0  Afraid - awful might happen 0  Total GAD 7 Score 2  Anxiety Difficulty Somewhat difficult   Most Recent Fall Screen:    05/13/2023   10:31 AM 06/14/2022    9:57 AM 05/18/2020    8:57 AM  Fall Risk   Falls in the  past year? 0 0 0  Number falls in past yr: 0 0 0  Injury with Fall? 0 0 0  Risk for fall due to : No Fall Risks No Fall Risks   Follow up Falls evaluation completed Falls evaluation completed     Past medical history, surgical history, medications, allergies, family history and social history reviewed with patient today and changes made to appropriate areas of the chart.  Past Medical History:  Past Medical History:  Diagnosis Date   Depression    Medications:  Current Outpatient Medications on File Prior to Visit  Medication Sig   medroxyPROGESTERone  (DEPO-PROVERA ) 150 MG/ML injection medroxyprogesterone  150 mg/mL intramuscular suspension   Vitamin D , Ergocalciferol , (DRISDOL ) 1.25  MG (50000 UNIT) CAPS capsule Take 1 capsule (50,000 Units total) by mouth every 7 (seven) days. (Patient not taking: Reported on 05/13/2023)   No current facility-administered medications on file prior to visit.   Surgical History:  Past Surgical History:  Procedure Laterality Date   WISDOM TOOTH EXTRACTION     Allergies:  Allergies  Allergen Reactions   Benzaclin [Clindamycin Phos-Benzoyl Perox]     Redness, topical reaction   Family History:  Family History  Problem Relation Age of Onset   Healthy Mother    Diabetes Father    Hypertension Father    Healthy Sister    Allergies Sister    Bipolar disorder Sister    Diabetes Maternal Grandmother        Objective:    BP 118/76   Pulse 68   Ht 5' 9.5 (1.765 m)   Wt 190 lb 12.8 oz (86.5 kg)   BMI 27.77 kg/m   Wt Readings from Last 3 Encounters:  05/13/23 190 lb 12.8 oz (86.5 kg)  03/24/23 192 lb 9.6 oz (87.4 kg)  03/04/23 187 lb 3.2 oz (84.9 kg)    Physical Exam Vitals and nursing note reviewed.  Constitutional:      General: She is not in acute distress.    Appearance: Normal appearance.  HENT:     Head: Normocephalic and atraumatic.     Right Ear: Hearing, tympanic membrane, ear canal and external ear normal.     Left Ear: Hearing, tympanic membrane, ear canal and external ear normal.     Nose: Nose normal.     Right Sinus: No maxillary sinus tenderness or frontal sinus tenderness.     Left Sinus: No maxillary sinus tenderness or frontal sinus tenderness.     Mouth/Throat:     Lips: Pink.     Mouth: Mucous membranes are moist.     Pharynx: Oropharynx is clear.  Eyes:     General: Lids are normal. Vision grossly intact.     Extraocular Movements: Extraocular movements intact.     Conjunctiva/sclera: Conjunctivae normal.     Pupils: Pupils are equal, round, and reactive to light.     Funduscopic exam:    Right eye: Red reflex present.        Left eye: Red reflex present.    Visual Fields: Right eye visual  fields normal and left eye visual fields normal.  Neck:     Thyroid : No thyromegaly.     Vascular: No carotid bruit.  Cardiovascular:     Rate and Rhythm: Normal rate and regular rhythm.     Chest Wall: PMI is not displaced.     Pulses: Normal pulses.          Dorsalis pedis pulses are 2+ on the right side  and 2+ on the left side.       Posterior tibial pulses are 2+ on the right side and 2+ on the left side.     Heart sounds: Normal heart sounds. No murmur heard. Pulmonary:     Effort: Pulmonary effort is normal. No respiratory distress.     Breath sounds: Normal breath sounds.  Abdominal:     General: Abdomen is flat. Bowel sounds are normal. There is no distension.     Palpations: Abdomen is soft. There is no hepatomegaly, splenomegaly or mass.     Tenderness: There is no abdominal tenderness. There is no right CVA tenderness, left CVA tenderness, guarding or rebound.  Musculoskeletal:        General: Normal range of motion.     Cervical back: Full passive range of motion without pain, normal range of motion and neck supple. No tenderness.     Right lower leg: No edema.     Left lower leg: No edema.  Feet:     Left foot:     Toenail Condition: Left toenails are normal.  Lymphadenopathy:     Cervical: No cervical adenopathy.     Upper Body:     Right upper body: No supraclavicular adenopathy.     Left upper body: No supraclavicular adenopathy.  Skin:    General: Skin is warm and dry.     Capillary Refill: Capillary refill takes less than 2 seconds.     Nails: There is no clubbing.  Neurological:     General: No focal deficit present.     Mental Status: She is alert and oriented to person, place, and time.     GCS: GCS eye subscore is 4. GCS verbal subscore is 5. GCS motor subscore is 6.     Sensory: Sensation is intact.     Motor: Motor function is intact.     Coordination: Coordination is intact.     Gait: Gait is intact.     Deep Tendon Reflexes: Reflexes are normal and  symmetric.  Psychiatric:        Attention and Perception: Attention normal.        Mood and Affect: Mood normal.        Speech: Speech normal.        Behavior: Behavior normal. Behavior is cooperative.        Thought Content: Thought content normal.        Cognition and Memory: Cognition and memory normal.        Judgment: Judgment normal.      Results for orders placed or performed in visit on 03/24/23  POCT Urinalysis DIP (Proadvantage Device)   Collection Time: 03/24/23 11:34 AM  Result Value Ref Range   Color, UA yellow yellow   Clarity, UA clear clear   Glucose, UA negative negative mg/dL   Bilirubin, UA negative negative   Ketones, POC UA negative negative mg/dL   Specific Gravity, Urine 1.015    Blood, UA negative negative   pH, UA 7.0 5.0 - 8.0   Protein Ur, POC negative negative mg/dL   Urobilinogen, Ur n    Nitrite, UA Negative Negative   Leukocytes, UA Negative Negative  POCT Wet Prep [facility])   Collection Time: 03/24/23  5:09 PM  Result Value Ref Range   Source Wet Prep POC vaginal    WBC, Wet Prep HPF POC     Bacteria Wet Prep HPF POC     BACTERIA WET PREP MORPHOLOGY POC  Clue Cells Wet Prep HPF POC     Clue Cells Wet Prep Whiff POC     Yeast Wet Prep HPF POC Few (A) None   KOH Wet Prep POC     Trichomonas Wet Prep HPF POC         Assessment & Plan:   Problem List Items Addressed This Visit     Generalized anxiety disorder   She reports ongoing anxiety and difficulty sleeping. Using hydroxyzine  for anxiety, which causes significant drowsiness. Not taking buspirone  or Lexapro . Discussed potential benefits of restarting buspirone  and the use of trazodone  for sleep, which may have fewer sedative effects compared to hydroxyzine . Explained that trazodone  is effective for sleep in low doses and typically does not cause morning grogginess. Discussed the option of taking buspirone  in the morning and evening, and possibly an extra dose during the day for  anxiety management.   - Send prescription for trazodone  25-50 mg for sleep   - Consider restarting buspirone  for anxiety management   - Monitor response to trazodone  and adjust dosage if necessary   - Encourage resuming individual therapy sessions        Relevant Medications   traZODone  (DESYREL ) 50 MG tablet   Encounter for annual physical exam - Primary   Relevant Medications   traZODone  (DESYREL ) 50 MG tablet   Other Relevant Orders   CBC with Differential/Platelet   CMP14+EGFR   Hemoglobin A1c   Lipid panel   TSH   Psychophysiological insomnia   She reports ongoing anxiety and difficulty sleeping. Using hydroxyzine  for anxiety, which causes significant drowsiness. Not taking buspirone  or Lexapro . Discussed potential benefits of restarting buspirone  and the use of trazodone  for sleep, which may have fewer sedative effects compared to hydroxyzine . Explained that trazodone  is effective for sleep in low doses and typically does not cause morning grogginess. Discussed the option of taking buspirone  in the morning and evening, and possibly an extra dose during the day for anxiety management.   - Send prescription for trazodone  25-50 mg for sleep   - Consider restarting buspirone  for anxiety management   - Monitor response to trazodone  and adjust dosage if necessary   - Encourage resuming individual therapy sessions        Relevant Medications   traZODone  (DESYREL ) 50 MG tablet   Vitamin D  deficiency   Relevant Orders   VITAMIN D  25 Hydroxy (Vit-D Deficiency, Fractures)   Hidradenitis axillaris   The axillary lump is healing well with no signs of inflammation, infection, or drainage. She reports no pain. Advised to monitor for any increase in size or signs of infection. Discussed that shaving is preferred over waxing to reduce the risk of infection.   - Advise warm compresses if the lump increases in size or shows signs of infection   - Resume normal activities including shaving   -  Recommend shaving over waxing to reduce risk of infection        Birth control counseling   Currently on Depo-Provera  and considering other birth control options due to concerns about long-term effects on fertility. Discussed various options including oral contraceptives, Nexplanon, and IUDs, along with their benefits and potential downsides. Advised that oral contraceptives have a higher risk of failure due to missed doses.  Discussed that longer acting options do have the potential to delay fertility, but there is no definitive time frame.  - Consider switching to oral contraceptives or Nexplanon if planning pregnancy in the next two years   -  Consult with OBGYN for further guidance on birth control options        Chronic idiopathic constipation   She reports constipation with a recent severe episode. Advised to increase water intake and consider dietary changes. Discussed the use of stool softeners and fiber supplements. Explained that stool softeners like Colace do not cause dependency, unlike stimulant laxatives. Recommended chia seeds for their gelatinous properties that help pull fluid into the intestines.   - Recommend Colace as a stool softener   - Suggest MiraLAX for routine use if needed   - Advise increasing dietary fiber, including chia seeds and flax seeds           Follow up plan: Return in about 1 year (around 05/12/2024) for CPE.  NEXT PREVENTATIVE PHYSICAL DUE IN 1 YEAR.  PATIENT COUNSELING PROVIDED FOR ALL ADULT PATIENTS: A well balanced diet low in saturated fats, cholesterol, and moderation in carbohydrates.  This can be as simple as monitoring portion sizes and cutting back on sugary beverages such as soda and juice to start with.    Daily water consumption of at least 64 ounces.  Physical activity at least 180 minutes per week.  If just starting out, start 10 minutes a day and work your way up.   This can be as simple as taking the stairs instead of the elevator  and walking 2-3 laps around the office  purposefully every day.   STD protection, partner selection, and regular testing if high risk.  Limited consumption of alcoholic beverages if alcohol is consumed. For men, I recommend no more than 14 alcoholic beverages per week, spread out throughout the week (max 2 per day). Avoid binge drinking or consuming large quantities of alcohol in one setting.  Please let me know if you feel you may need help with reduction or quitting alcohol consumption.   Avoidance of nicotine, if used. Please let me know if you feel you may need help with reduction or quitting nicotine use.   Daily mental health attention. This can be in the form of 5 minute daily meditation, prayer, journaling, yoga, reflection, etc.  Purposeful attention to your emotions and mental state can significantly improve your overall wellbeing  and  Health.  Please know that I am here to help you with all of your health care goals and am happy to work with you to find a solution that works best for you.  The greatest advice I have received with any changes in life are to take it one step at a time, that even means if all you can focus on is the next 60 seconds, then do that and celebrate your victories.  With any changes in life, you will have set backs, and that is OK. The important thing to remember is, if you have a set back, it is not a failure, it is an opportunity to try again! Screening Testing Mammogram Every 1 -2 years based on history and risk factors Starting at age 89 Pap Smear Ages 21-39 every 3 years Ages 39-65 every 5 years with HPV testing More frequent testing may be required based on results and history Colon Cancer Screening Every 1-10 years based on test performed, risk factors, and history Starting at age 65 Bone Density Screening Every 2-10 years based on history Starting at age 75 for women Recommendations for men differ based on medication usage, history, and  risk factors AAA Screening One time ultrasound Men 29-54 years old who have every smoked Lung Cancer  Screening Low Dose Lung CT every 12 months Age 53-80 years with a 30 pack-year smoking history who still smoke or who have quit within the last 15 years   Screening Labs Routine  Labs: Complete Blood Count (CBC), Complete Metabolic Panel (CMP), Cholesterol (Lipid Panel) Every 6-12 months based on history and medications May be recommended more frequently based on current conditions or previous results Hemoglobin A1c Lab Every 3-12 months based on history and previous results Starting at age 7 or earlier with diagnosis of diabetes, high cholesterol, BMI >26, and/or risk factors Frequent monitoring for patients with diabetes to ensure blood sugar control Thyroid  Panel (TSH) Every 6 months based on history, symptoms, and risk factors May be repeated more often if on medication HIV One time testing for all patients 70 and older May be repeated more frequently for patients with increased risk factors or exposure Hepatitis C One time testing for all patients 60 and older May be repeated more frequently for patients with increased risk factors or exposure Gonorrhea, Chlamydia Every 12 months for all sexually active persons 13-24 years Additional monitoring may be recommended for those who are considered high risk or who have symptoms Every 12 months for any woman on birth control, regardless of sexual activity PSA Men 50-58 years old with risk factors Additional screening may be recommended from age 47-69 based on risk factors, symptoms, and history  Vaccine Recommendations Tetanus Booster All adults every 10 years Flu Vaccine All patients 6 months and older every year COVID Vaccine All patients 12 years and older Initial dosing with booster May recommend additional booster based on age and health history HPV Vaccine 2 doses all patients age 32-26 Dosing may be considered for  patients over 26 Shingles Vaccine (Shingrix) 2 doses all adults 55 years and older Pneumonia (Pneumovax 23) All adults 65 years and older May recommend earlier dosing based on health history One year apart from Prevnar 13 Pneumonia (Prevnar 82) All adults 65 years and older Dosed 1 year after Pneumovax 23 Pneumonia (Prevnar 20) One time alternative to the two dosing of 13 and 23 For all adults with initial dose of 23, 20 is recommended 1 year later For all adults with initial dose of 13, 23 is still recommended as second option 1 year later

## 2023-05-13 NOTE — Assessment & Plan Note (Signed)

## 2023-05-13 NOTE — Assessment & Plan Note (Signed)
 She reports ongoing anxiety and difficulty sleeping. Using hydroxyzine  for anxiety, which causes significant drowsiness. Not taking buspirone  or Lexapro . Discussed potential benefits of restarting buspirone  and the use of trazodone  for sleep, which may have fewer sedative effects compared to hydroxyzine . Explained that trazodone  is effective for sleep in low doses and typically does not cause morning grogginess. Discussed the option of taking buspirone  in the morning and evening, and possibly an extra dose during the day for anxiety management.   - Send prescription for trazodone  25-50 mg for sleep   - Consider restarting buspirone  for anxiety management   - Monitor response to trazodone  and adjust dosage if necessary   - Encourage resuming individual therapy sessions

## 2023-05-13 NOTE — Assessment & Plan Note (Signed)
 The axillary lump is healing well with no signs of inflammation, infection, or drainage. She reports no pain. Advised to monitor for any increase in size or signs of infection. Discussed that shaving is preferred over waxing to reduce the risk of infection.   - Advise warm compresses if the lump increases in size or shows signs of infection   - Resume normal activities including shaving   - Recommend shaving over waxing to reduce risk of infection

## 2023-05-13 NOTE — Assessment & Plan Note (Signed)
 She reports constipation with a recent severe episode. Advised to increase water intake and consider dietary changes. Discussed the use of stool softeners and fiber supplements. Explained that stool softeners like Colace do not cause dependency, unlike stimulant laxatives. Recommended chia seeds for their gelatinous properties that help pull fluid into the intestines.   - Recommend Colace as a stool softener   - Suggest MiraLAX for routine use if needed   - Advise increasing dietary fiber, including chia seeds and flax seeds

## 2023-05-14 LAB — CBC WITH DIFFERENTIAL/PLATELET
Basophils Absolute: 0 10*3/uL (ref 0.0–0.2)
Basos: 0 %
EOS (ABSOLUTE): 0 10*3/uL (ref 0.0–0.4)
Eos: 0 %
Hematocrit: 41.9 % (ref 34.0–46.6)
Hemoglobin: 13.9 g/dL (ref 11.1–15.9)
Immature Grans (Abs): 0 10*3/uL (ref 0.0–0.1)
Immature Granulocytes: 0 %
Lymphocytes Absolute: 2.3 10*3/uL (ref 0.7–3.1)
Lymphs: 48 %
MCH: 31.2 pg (ref 26.6–33.0)
MCHC: 33.2 g/dL (ref 31.5–35.7)
MCV: 94 fL (ref 79–97)
Monocytes Absolute: 0.4 10*3/uL (ref 0.1–0.9)
Monocytes: 8 %
Neutrophils Absolute: 2.1 10*3/uL (ref 1.4–7.0)
Neutrophils: 44 %
Platelets: 330 10*3/uL (ref 150–450)
RBC: 4.45 x10E6/uL (ref 3.77–5.28)
RDW: 11.8 % (ref 11.7–15.4)
WBC: 4.8 10*3/uL (ref 3.4–10.8)

## 2023-05-14 LAB — CMP14+EGFR
ALT: 12 [IU]/L (ref 0–32)
AST: 15 [IU]/L (ref 0–40)
Albumin: 4.7 g/dL (ref 4.0–5.0)
Alkaline Phosphatase: 133 [IU]/L — ABNORMAL HIGH (ref 44–121)
BUN/Creatinine Ratio: 12 (ref 9–23)
BUN: 10 mg/dL (ref 6–20)
Bilirubin Total: 1 mg/dL (ref 0.0–1.2)
CO2: 21 mmol/L (ref 20–29)
Calcium: 9.4 mg/dL (ref 8.7–10.2)
Chloride: 103 mmol/L (ref 96–106)
Creatinine, Ser: 0.83 mg/dL (ref 0.57–1.00)
Globulin, Total: 2.8 g/dL (ref 1.5–4.5)
Glucose: 74 mg/dL (ref 70–99)
Potassium: 4.4 mmol/L (ref 3.5–5.2)
Sodium: 139 mmol/L (ref 134–144)
Total Protein: 7.5 g/dL (ref 6.0–8.5)
eGFR: 100 mL/min/{1.73_m2} (ref >59)

## 2023-05-14 LAB — LIPID PANEL
Chol/HDL Ratio: 3.7 {ratio} (ref 0.0–4.4)
Cholesterol, Total: 234 mg/dL — ABNORMAL HIGH (ref 100–199)
HDL: 64 mg/dL (ref >39)
LDL Chol Calc (NIH): 160 mg/dL — ABNORMAL HIGH (ref 0–99)
Triglycerides: 57 mg/dL (ref 0–149)
VLDL Cholesterol Cal: 10 mg/dL (ref 5–40)

## 2023-05-14 LAB — HEMOGLOBIN A1C
Est. average glucose Bld gHb Est-mCnc: 111 mg/dL
Hgb A1c MFr Bld: 5.5 % (ref 4.8–5.6)

## 2023-05-14 LAB — TSH: TSH: 1.08 u[IU]/mL (ref 0.450–4.500)

## 2023-05-14 LAB — VITAMIN D 25 HYDROXY (VIT D DEFICIENCY, FRACTURES): Vit D, 25-Hydroxy: 20.7 ng/mL — ABNORMAL LOW (ref 30.0–100.0)

## 2023-05-27 ENCOUNTER — Encounter: Payer: Self-pay | Admitting: Nurse Practitioner

## 2023-05-27 MED ORDER — VITAMIN D (ERGOCALCIFEROL) 1.25 MG (50000 UNIT) PO CAPS: 50000.0000 [IU] | ORAL_CAPSULE | ORAL | 3 refills | Status: AC

## 2023-05-27 NOTE — Addendum Note (Signed)
Addended by: Jennine Peddy, Huntley Dec E on: 05/27/2023 07:53 AM   Modules accepted: Orders

## 2023-05-30 DIAGNOSIS — Z713 Dietary counseling and surveillance: Secondary | ICD-10-CM | POA: Diagnosis not present

## 2023-06-03 DIAGNOSIS — F4010 Social phobia, unspecified: Secondary | ICD-10-CM | POA: Diagnosis not present

## 2023-06-03 DIAGNOSIS — F4312 Post-traumatic stress disorder, chronic: Secondary | ICD-10-CM | POA: Diagnosis not present

## 2023-06-06 ENCOUNTER — Other Ambulatory Visit: Payer: Self-pay | Admitting: Nurse Practitioner

## 2023-06-06 DIAGNOSIS — F5104 Psychophysiologic insomnia: Secondary | ICD-10-CM

## 2023-06-06 DIAGNOSIS — Z0000 Encounter for general adult medical examination without abnormal findings: Secondary | ICD-10-CM

## 2023-06-06 NOTE — Telephone Encounter (Signed)
 Requesting change to 90 day supply instead of 30 day supply

## 2023-06-10 DIAGNOSIS — F4010 Social phobia, unspecified: Secondary | ICD-10-CM | POA: Diagnosis not present

## 2023-06-10 DIAGNOSIS — F4312 Post-traumatic stress disorder, chronic: Secondary | ICD-10-CM | POA: Diagnosis not present

## 2023-06-17 DIAGNOSIS — F4010 Social phobia, unspecified: Secondary | ICD-10-CM | POA: Diagnosis not present

## 2023-06-17 DIAGNOSIS — F4312 Post-traumatic stress disorder, chronic: Secondary | ICD-10-CM | POA: Diagnosis not present

## 2023-06-20 DIAGNOSIS — Z3042 Encounter for surveillance of injectable contraceptive: Secondary | ICD-10-CM | POA: Diagnosis not present

## 2023-06-20 DIAGNOSIS — Z130 Encounter for screening for diseases of the blood and blood-forming organs and certain disorders involving the immune mechanism: Secondary | ICD-10-CM | POA: Diagnosis not present

## 2023-06-20 DIAGNOSIS — R6882 Decreased libido: Secondary | ICD-10-CM | POA: Diagnosis not present

## 2023-06-20 DIAGNOSIS — Z01411 Encounter for gynecological examination (general) (routine) with abnormal findings: Secondary | ICD-10-CM | POA: Diagnosis not present

## 2023-06-20 DIAGNOSIS — R319 Hematuria, unspecified: Secondary | ICD-10-CM | POA: Diagnosis not present

## 2023-06-24 DIAGNOSIS — F4010 Social phobia, unspecified: Secondary | ICD-10-CM | POA: Diagnosis not present

## 2023-06-24 DIAGNOSIS — F4312 Post-traumatic stress disorder, chronic: Secondary | ICD-10-CM | POA: Diagnosis not present

## 2023-07-07 DIAGNOSIS — Z713 Dietary counseling and surveillance: Secondary | ICD-10-CM | POA: Diagnosis not present

## 2023-07-08 DIAGNOSIS — F4010 Social phobia, unspecified: Secondary | ICD-10-CM | POA: Diagnosis not present

## 2023-07-08 DIAGNOSIS — F4312 Post-traumatic stress disorder, chronic: Secondary | ICD-10-CM | POA: Diagnosis not present

## 2023-07-14 DIAGNOSIS — F4010 Social phobia, unspecified: Secondary | ICD-10-CM | POA: Diagnosis not present

## 2023-07-14 DIAGNOSIS — F4312 Post-traumatic stress disorder, chronic: Secondary | ICD-10-CM | POA: Diagnosis not present

## 2023-07-21 DIAGNOSIS — F4312 Post-traumatic stress disorder, chronic: Secondary | ICD-10-CM | POA: Diagnosis not present

## 2023-07-21 DIAGNOSIS — F4010 Social phobia, unspecified: Secondary | ICD-10-CM | POA: Diagnosis not present

## 2023-07-23 DIAGNOSIS — Z713 Dietary counseling and surveillance: Secondary | ICD-10-CM | POA: Diagnosis not present

## 2023-07-28 DIAGNOSIS — F4312 Post-traumatic stress disorder, chronic: Secondary | ICD-10-CM | POA: Diagnosis not present

## 2023-07-28 DIAGNOSIS — F4010 Social phobia, unspecified: Secondary | ICD-10-CM | POA: Diagnosis not present

## 2023-07-31 DIAGNOSIS — Z713 Dietary counseling and surveillance: Secondary | ICD-10-CM | POA: Diagnosis not present

## 2023-08-05 DIAGNOSIS — F4010 Social phobia, unspecified: Secondary | ICD-10-CM | POA: Diagnosis not present

## 2023-08-11 DIAGNOSIS — Z713 Dietary counseling and surveillance: Secondary | ICD-10-CM | POA: Diagnosis not present

## 2023-08-12 DIAGNOSIS — F4010 Social phobia, unspecified: Secondary | ICD-10-CM | POA: Diagnosis not present

## 2023-08-19 DIAGNOSIS — F4312 Post-traumatic stress disorder, chronic: Secondary | ICD-10-CM | POA: Diagnosis not present

## 2023-08-19 DIAGNOSIS — F4010 Social phobia, unspecified: Secondary | ICD-10-CM | POA: Diagnosis not present

## 2023-08-26 DIAGNOSIS — F4010 Social phobia, unspecified: Secondary | ICD-10-CM | POA: Diagnosis not present

## 2023-09-02 DIAGNOSIS — F4312 Post-traumatic stress disorder, chronic: Secondary | ICD-10-CM | POA: Diagnosis not present

## 2023-09-02 DIAGNOSIS — F4010 Social phobia, unspecified: Secondary | ICD-10-CM | POA: Diagnosis not present

## 2023-09-09 DIAGNOSIS — F4312 Post-traumatic stress disorder, chronic: Secondary | ICD-10-CM | POA: Diagnosis not present

## 2023-09-09 DIAGNOSIS — Z713 Dietary counseling and surveillance: Secondary | ICD-10-CM | POA: Diagnosis not present

## 2023-09-12 DIAGNOSIS — Z3042 Encounter for surveillance of injectable contraceptive: Secondary | ICD-10-CM | POA: Diagnosis not present

## 2023-09-16 DIAGNOSIS — F4312 Post-traumatic stress disorder, chronic: Secondary | ICD-10-CM | POA: Diagnosis not present

## 2023-09-23 DIAGNOSIS — F4312 Post-traumatic stress disorder, chronic: Secondary | ICD-10-CM | POA: Diagnosis not present

## 2023-09-24 DIAGNOSIS — Z713 Dietary counseling and surveillance: Secondary | ICD-10-CM | POA: Diagnosis not present

## 2023-10-01 DIAGNOSIS — F4312 Post-traumatic stress disorder, chronic: Secondary | ICD-10-CM | POA: Diagnosis not present

## 2023-10-07 DIAGNOSIS — F4312 Post-traumatic stress disorder, chronic: Secondary | ICD-10-CM | POA: Diagnosis not present

## 2023-10-14 DIAGNOSIS — F4312 Post-traumatic stress disorder, chronic: Secondary | ICD-10-CM | POA: Diagnosis not present

## 2023-10-15 DIAGNOSIS — Z713 Dietary counseling and surveillance: Secondary | ICD-10-CM | POA: Diagnosis not present

## 2023-10-23 DIAGNOSIS — F4312 Post-traumatic stress disorder, chronic: Secondary | ICD-10-CM | POA: Diagnosis not present

## 2023-10-29 DIAGNOSIS — F4312 Post-traumatic stress disorder, chronic: Secondary | ICD-10-CM | POA: Diagnosis not present

## 2023-11-05 DIAGNOSIS — F4312 Post-traumatic stress disorder, chronic: Secondary | ICD-10-CM | POA: Diagnosis not present

## 2023-11-12 DIAGNOSIS — F4312 Post-traumatic stress disorder, chronic: Secondary | ICD-10-CM | POA: Diagnosis not present

## 2023-11-18 DIAGNOSIS — F4312 Post-traumatic stress disorder, chronic: Secondary | ICD-10-CM | POA: Diagnosis not present

## 2023-11-19 DIAGNOSIS — Z713 Dietary counseling and surveillance: Secondary | ICD-10-CM | POA: Diagnosis not present

## 2024-05-17 ENCOUNTER — Encounter: Payer: BC Managed Care – PPO | Admitting: Nurse Practitioner
